# Patient Record
Sex: Male | Born: 1940 | Hispanic: No
Health system: Midwestern US, Academic
[De-identification: ages and names within clinical notes are randomized; demographics above are authoritative.]

---

## 2021-06-29 ENCOUNTER — Encounter: Admit: 2021-06-29 | Discharge: 2021-06-29 | Payer: MEDICARE

## 2021-07-23 ENCOUNTER — Encounter: Admit: 2021-07-23 | Discharge: 2021-07-23 | Payer: MEDICARE

## 2021-07-23 NOTE — Telephone Encounter
Pts daughter F.Keith LV about pts app-t. This person os not on ROI. Called and LV to pt to see if he has any questions.Pt notified that this person is not on ROI and communication cant be made. Call back provided

## 2021-08-25 ENCOUNTER — Encounter
Admit: 2021-08-25 | Discharge: 2021-08-25 | Payer: MEDICARE | Primary: Student in an Organized Health Care Education/Training Program

## 2021-08-25 ENCOUNTER — Ambulatory Visit
Admit: 2021-08-25 | Discharge: 2021-08-25 | Payer: MEDICARE | Primary: Student in an Organized Health Care Education/Training Program

## 2021-08-25 DIAGNOSIS — E538 Deficiency of other specified B group vitamins: Secondary | ICD-10-CM

## 2021-08-25 DIAGNOSIS — E119 Type 2 diabetes mellitus without complications: Secondary | ICD-10-CM

## 2021-08-25 DIAGNOSIS — E559 Vitamin D deficiency, unspecified: Secondary | ICD-10-CM

## 2021-08-25 DIAGNOSIS — E785 Hyperlipidemia, unspecified: Secondary | ICD-10-CM

## 2021-08-25 DIAGNOSIS — I10 Essential (primary) hypertension: Secondary | ICD-10-CM

## 2021-08-25 DIAGNOSIS — E7800 Pure hypercholesterolemia, unspecified: Secondary | ICD-10-CM

## 2021-08-25 DIAGNOSIS — F0390 Unspecified dementia without behavioral disturbance: Secondary | ICD-10-CM

## 2021-08-25 DIAGNOSIS — G20 Parkinson's disease: Secondary | ICD-10-CM

## 2021-08-25 DIAGNOSIS — Z9221 Personal history of antineoplastic chemotherapy: Secondary | ICD-10-CM

## 2021-08-25 LAB — CBC AND DIFF
ABSOLUTE BASO COUNT: 0 K/UL (ref 0–0.20)
ABSOLUTE EOS COUNT: 0.1 K/UL (ref 0–0.45)
ABSOLUTE LYMPH COUNT: 2.2 K/UL (ref 1.0–4.8)
ABSOLUTE MONO COUNT: 0.5 K/UL (ref 0–0.80)
ABSOLUTE NEUTROPHIL: 5.3 K/UL (ref 1.8–7.0)
BASOPHILS %: 0 % (ref 0–2)
EOSINOPHILS %: 2 % (ref >60)
HEMATOCRIT: 36 % — ABNORMAL LOW (ref 40–50)
HEMOGLOBIN: 12 g/dL — ABNORMAL LOW (ref 13.5–16.5)
LYMPHOCYTES %: 27 % — ABNORMAL LOW (ref 24–44)
MCH: 30 pg (ref 26–34)
MCHC: 34 g/dL (ref 32.0–36.0)
MCV: 89 FL (ref 80–100)
MONOCYTES %: 7 % (ref 4–12)
MPV: 7.2 FL (ref 7–11)
NEUTROPHILS %: 64 % (ref 41–77)
PLATELET COUNT: 267 K/UL (ref 150–400)
RBC COUNT: 4 M/UL — ABNORMAL LOW (ref 4.4–5.5)
RDW: 14 % (ref 11–15)
WBC COUNT: 8.3 K/UL (ref 4.5–11.0)

## 2021-08-25 LAB — COMPREHENSIVE METABOLIC PANEL
POTASSIUM: 3.7 MMOL/L (ref 3.5–5.1)
SODIUM: 141 MMOL/L (ref 137–147)

## 2021-08-25 LAB — TSH WITH FREE T4 REFLEX: TSH: 0.7 uU/mL (ref 0.35–5.00)

## 2021-08-25 LAB — VITAMIN B12: VITAMIN B12: 467 pg/mL (ref 180–914)

## 2021-09-02 ENCOUNTER — Encounter
Admit: 2021-09-02 | Discharge: 2021-09-02 | Payer: MEDICARE | Primary: Student in an Organized Health Care Education/Training Program

## 2021-09-02 NOTE — Telephone Encounter
Patient's sister LVM stating patient's BP has been low. Amlodipine was discontinued at last visit. Sister states she added it back because his BP was over 170.     Yesterday BP was 85/42. Sister states she call an ambulance and took patient to [facility] ER because he was feeling weak and continued to have low BP.     Patient is home today and has not had any BP medication for two days. BP running in low 100s. Last reading was 104/64.    Jeanine Luz, RN

## 2021-09-20 ENCOUNTER — Encounter
Admit: 2021-09-20 | Discharge: 2021-09-20 | Payer: MEDICARE | Primary: Student in an Organized Health Care Education/Training Program

## 2021-09-21 NOTE — Telephone Encounter
-----   Message from Cristy Folks, MD sent at 09/20/2021 12:45 PM CST -----  Patient sister called on-call provider last night with reported increasing confusion.  Please call and see how he is doing.  They wonder if it was due to stopping his Aricept-please see when they stopped this and see what his vital signs have been, etc.  He sees me on the eighth, please make sure they feel comfortable with waiting that long-if he is doing poorly please let me know

## 2021-09-21 NOTE — Telephone Encounter
Contacted patient's sister to follow up on patient's confusion. Sister states he is doing better and is no longer confused. Patient has been off of Aricept since last PCP visit January 11th.  BP and blood sugar have been fluctuating. BP this morning was 98/58, 101/85 at 11am, and 110/61 at 3:43pm. Blood sugar in 140s. Sister states they are comfortable waiting until f/u on 2/8 to be seen by PCP.     Jeanine Luz, RN

## 2021-09-22 ENCOUNTER — Encounter
Admit: 2021-09-22 | Discharge: 2021-09-22 | Payer: MEDICARE | Primary: Student in an Organized Health Care Education/Training Program

## 2021-09-22 ENCOUNTER — Ambulatory Visit
Admit: 2021-09-22 | Discharge: 2021-09-22 | Payer: MEDICARE | Primary: Student in an Organized Health Care Education/Training Program

## 2021-09-22 VITALS — BP 112/67 | HR 67 | Temp 97.90000°F | Resp 18 | Ht 72.0 in | Wt 187.0 lb

## 2021-09-22 DIAGNOSIS — G20 Parkinson's disease: Secondary | ICD-10-CM

## 2021-09-22 DIAGNOSIS — E785 Hyperlipidemia, unspecified: Secondary | ICD-10-CM

## 2021-09-22 DIAGNOSIS — Z9221 Personal history of antineoplastic chemotherapy: Secondary | ICD-10-CM

## 2021-09-22 DIAGNOSIS — F0390 Unspecified dementia without behavioral disturbance: Secondary | ICD-10-CM

## 2021-09-22 DIAGNOSIS — I951 Orthostatic hypotension: Secondary | ICD-10-CM

## 2021-09-22 DIAGNOSIS — R32 Unspecified urinary incontinence: Secondary | ICD-10-CM

## 2021-09-22 DIAGNOSIS — E119 Type 2 diabetes mellitus without complications: Secondary | ICD-10-CM

## 2021-09-22 DIAGNOSIS — I10 Essential (primary) hypertension: Secondary | ICD-10-CM

## 2021-09-22 DIAGNOSIS — R0989 Other specified symptoms and signs involving the circulatory and respiratory systems: Principal | ICD-10-CM

## 2021-09-22 MED ORDER — MIDODRINE 2.5 MG PO TAB
2.5 mg | ORAL_TABLET | Freq: Two times a day (BID) | ORAL | 3 refills | Status: AC | PRN
Start: 2021-09-22 — End: ?

## 2021-09-22 NOTE — Progress Notes
Date of Service: 09/22/2021    Dillon Wheeler is a 81 y.o. male.  DOB: [birth year]  MRN: [id number]     Assessment and Plan:  The patient is an 81 year old male with past medical history of non-Hodgkin's lymphoma of the testicle, low testosterone, B12 deficiency, HTN, parkinsonism presents to follow-up the issues below    #HTN  #Orthostatic hypotension: Discontinued amlodipine and lisinopril at previous visit.  Discussed with sister that I am not concerned about intermittent blood pressures in the 180s since the majority of his pressures are below 100.  Discussed the risk of orthostasis/falls/decreased perfusion with hypotension outweighs the risk of intermittently elevated SBP.  - Midodrine 2.5 mg as needed for SBP in the 80s or profound dizziness  - Advised sister not to take blood pressure as frequently (was taking more than 10 times in a row over an hour for any abnormal readings)    #decreased pulse: on R foot with sister describing intermittent mottling/bluness. Pulse not palpable on R but is 1+ on L.   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 Had resolved recently.  Currently with Foley secondary to retention in the setting of UTI.  Also had OAB symptoms.  Unclear if these were improved after stopping Aricept and also his [name] since there was not a long period of time prior to Foley placement.  - Patient sister feels he is doing much better with Foley because he is actually sleeping.  Discussed risk of long-term Foley versus straight cath and she would have to be the one to do the straight cath if he was agreeable.  We will follow-up once he has a void trial with urology.  - Advised patient sister to hold off on looking into Myrbetriq since he currently has retention  ?  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  Following with Dr. Serafina Royals.  - Continue Sinemet.  Discussed looking into long-acting overnight formulation since symptomology so much worse in the morning.  Will be establishing with neurology here in the near future  - Referral sent for PT due to frequent falls-we will need to look into if this has occurred at follow-up  - Request records from neurology again has been have not received them.  Patient sister will go to the office directly if they have not received prior to her next neurology appointment  ???  #Major neurocognitive disorder: Appears memory changes occurred before parkinsonism, so LBD is consideration.  They did have an MRI report showing severe confluent microvascular changes so there is likely a mixed picture.  - DCd Aricept due to worsening insomnia, nocturia  - Continue on statin for now. consider ASA at fu  ?  #History of alcohol use: Heavy use for 10 years, sober since 2021.  Currently on folate and thiamine.  With polypharmacy and patient eating a well-rounded diet not drinking, will discontinue these.  Could repeat levels at follow-up    #Vitamin D deficiency: continue on cholecalciferol  ?  #MDD: Continue on Lexapro  ?  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  #B12 deficiency: within range in 08/2021, continue supplement  ?  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  We will request records at follow-up  ?  #diverticulitis: with partial bowel resection in 2016.  ?  #History of non-Hodgkin's lymphoma of the testicle: Diagnosed in 2010.  S/p orchiectomy and R-CHOP.  No disease recurrence.  We will look into what follow-up needs to be done  ?  ?         Immunization History   Administered Date(s) Administered   ? COVID-19 (MODERNA), mRNA vacc, 100 mcg/0.5 mL (PF) 09/20/2019, 10/23/2019   ? COVID-19 (PFIZER), mRNA vacc, 30 mcg/0.3 mL (PF) 07/02/2020   ? Covid-19 Bivalent Booster (MODERNA), mRNA Vacc (Historical) 09/03/2021   ? Flu Vaccine =>65 YO High-Dose Quadrivalent (PF) 06/27/2019, 06/25/2020, 05/11/2021   ? Flu vaccine, inj unspecified (Historical) 06/27/2019 ? Pneumococcal Vaccine (23-Val Adult) 08/20/2018   ? Pneumococcal Vaccine(13-Val Peds/immunocompromised adult) 06/26/2012   ? Tdap Vaccine 06/26/2012   ? Varicella-Zoster Vaccine - live (ZOSTAVAX) 07/31/2014     Health Maintenance Summary     This patient has no relevant Health Maintenance data.        A total of 10 minutes was spent on chart review  A total of 40 minutes was spent on the visit including gathering history, examining patient, interpreting results, communicating with other providers and developing an assessment and plan.    Subjective:             History of Present Illness  #HTN: Stopped amlodipine and kept on lisinopril last visit.  They did have a period time where his blood pressures were running higher so they restarted his amlodipine and systolics fell to the 70s and 80s.     #Urinary incontinence: Stopped Aricept and Toviaz.  Treat BPH?  Got a foley last week. Not sureif he was sleeping better prior to the foley.    #GERD: Stop PPI an no recurrent symptoms.     #DM2: A1c 6.6 in 08/2021 and metformin advised to stop but they didn't. Worried bc level has been elevate dto 170s occasioanlly    #Increased confusion?-maybe 2/2 uti     Review of Systems        Surgical History:   Procedure Laterality Date   ? ORCHIECTOMY  03/06/2009    right     No family history on file.  Social History     Socioeconomic History   ? Marital status: Widowed   ? Number of children: 3   Tobacco Use   ? Smoking status: Never   Substance and Sexual Activity   ? Alcohol use: Yes     Comment: none currently. previosuly 6pk per week. Past 45yr sister states daily   ? Drug use: No   Social History Narrative    Lives with sister.     Dependent in all IADLs    Widowed 10uyr ago. Was in a long term relationship after that that was unhealthy per family.        Objective:         ? carbidopa/levodopa (SINEMET) 25/100 mg tablet    ? CHOLEcalciferoL (vitamin D3) (VITAMIN D3) 1,000 units tablet Take 5,000 Units by mouth daily.   ? CYANOCOBALAMIN (VITAMIN B-12) (VITAMIN B-12 PO) Take 1,000 mg by mouth daily.   ? escitalopram oxalate (LEXAPRO) 20 mg tablet Take 20 mg by mouth daily.   ? lisinopriL (ZESTRIL) 20 mg tablet Take 20 mg by mouth daily.   ? metFORMIN (GLUCOPHAGE) 500 mg tablet Take 500 mg by mouth twice daily with meals.   ? other medication Take 1 Dose by mouth twice daily. MacuTab 2X Extra streangth Eye Vitamin   ? pravastatin (PRAVACHOL) 40 mg tablet Take 40 mg by mouth daily.   ? vitamins, B complex tab Take 1 tablet  by mouth daily.     There were no vitals filed for this visit.  There is no height or weight on file to calculate BMI.     Labwork reviewed:  Lab Results   Component Value Date/Time    HGBA1C 6.6 (H) 08/25/2021 06:05 PM    TSH 0.79 08/25/2021 06:05 PM    NA 141 08/25/2021 06:05 PM    K 3.7 08/25/2021 06:05 PM    CL 103 08/25/2021 06:05 PM    CO2 26 08/25/2021 06:05 PM    GAP 12 08/25/2021 06:05 PM    BUN 22 08/25/2021 06:05 PM    CR 0.81 08/25/2021 06:05 PM    GLU 160 (H) 08/25/2021 06:05 PM    CA 9.5 08/25/2021 06:05 PM    ALBUMIN 3.9 08/25/2021 06:05 PM    TOTPROT 7.2 08/25/2021 06:05 PM    ALKPHOS 63 08/25/2021 06:05 PM    AST 14 08/25/2021 06:05 PM    ALT 3 (L) 08/25/2021 06:05 PM    TOTBILI 0.5 08/25/2021 06:05 PM    GFR >60 01/30/2014 08:27 AM    GFRAA >60 01/30/2014 08:27 AM         Physical Exam

## 2021-09-28 ENCOUNTER — Encounter
Admit: 2021-09-28 | Discharge: 2021-09-28 | Payer: MEDICARE | Primary: Student in an Organized Health Care Education/Training Program

## 2021-10-01 ENCOUNTER — Encounter
Admit: 2021-10-01 | Discharge: 2021-10-01 | Payer: MEDICARE | Primary: Student in an Organized Health Care Education/Training Program

## 2021-10-05 ENCOUNTER — Encounter
Admit: 2021-10-05 | Discharge: 2021-10-05 | Payer: MEDICARE | Primary: Student in an Organized Health Care Education/Training Program

## 2021-10-06 NOTE — Telephone Encounter
Some refill protocol elements NOT Met  Medication name: Escitalopram  Medication Strength: 110m    Beers Criteria: Use with caution in patients 65+    Routed to Provider     Requested Prescriptions   Pending Prescriptions Disp Refills    escitalopram oxalate (LEXAPRO) 20 mg tablet 90 tablet 0     Sig: Take one tablet by mouth daily.       Psychiatry: Antidepressants - SSRI, TCAs, Serotonin Modulators & SNeRIs Failed - 10/05/2021  6:08 PM        Failed - Patient is < 613years old. Beers Criteria: Use with caution in patients 65+        Passed - Depression Screening completed within the last 6 months        Passed - Valid encounter within last 6 months     Recent Visits  Date Type Provider Dept   09/22/21 Office Visit Stand, AHattie Perch MD [facility]   08/25/21 Office Visit Stand, AHattie Perch MD [facility]   Showing recent visits within past 720 days and meeting all other requirements  Future Appointments  Date Type Provider Dept   12/01/21 Appointment Stand, AHattie Perch MD [facility]   Showing future appointments within next 360 days and meeting all other requirements               CJeanine Luz RN

## 2021-10-12 ENCOUNTER — Encounter
Admit: 2021-10-12 | Discharge: 2021-10-12 | Payer: MEDICARE | Primary: Student in an Organized Health Care Education/Training Program

## 2021-10-19 ENCOUNTER — Encounter
Admit: 2021-10-19 | Discharge: 2021-10-19 | Payer: MEDICARE | Primary: Student in an Organized Health Care Education/Training Program

## 2021-11-09 ENCOUNTER — Encounter
Admit: 2021-11-09 | Discharge: 2021-11-09 | Payer: MEDICARE | Primary: Student in an Organized Health Care Education/Training Program

## 2021-11-10 ENCOUNTER — Encounter
Admit: 2021-11-10 | Discharge: 2021-11-10 | Payer: MEDICARE | Primary: Student in an Organized Health Care Education/Training Program

## 2021-11-16 ENCOUNTER — Encounter
Admit: 2021-11-16 | Discharge: 2021-11-16 | Payer: MEDICARE | Primary: Student in an Organized Health Care Education/Training Program

## 2021-11-23 ENCOUNTER — Encounter
Admit: 2021-11-23 | Discharge: 2021-11-23 | Payer: MEDICARE | Primary: Student in an Organized Health Care Education/Training Program

## 2021-11-23 DIAGNOSIS — F0390 Unspecified dementia without behavioral disturbance: Secondary | ICD-10-CM

## 2021-11-23 DIAGNOSIS — G20 Parkinson's disease: Secondary | ICD-10-CM

## 2021-11-23 NOTE — Telephone Encounter
Dillon Wheeler with [facility] LVM stating she met with patient's family today and they would like to elect for hospice. Requesting order to eval and treat for hospice.     Jeanine Luz, RN

## 2021-11-23 NOTE — Telephone Encounter
Order faxed to [facility] at [phone].  Jeanine Luz, RN

## 2021-12-01 ENCOUNTER — Encounter
Admit: 2021-12-01 | Discharge: 2021-12-01 | Payer: MEDICARE | Primary: Student in an Organized Health Care Education/Training Program

## 2021-12-01 ENCOUNTER — Ambulatory Visit
Admit: 2021-12-01 | Discharge: 2021-12-01 | Payer: MEDICARE | Primary: Student in an Organized Health Care Education/Training Program

## 2021-12-01 DIAGNOSIS — I10 Essential (primary) hypertension: Secondary | ICD-10-CM

## 2021-12-01 DIAGNOSIS — G20 Parkinson's disease: Secondary | ICD-10-CM

## 2021-12-01 DIAGNOSIS — Z9221 Personal history of antineoplastic chemotherapy: Secondary | ICD-10-CM

## 2021-12-01 DIAGNOSIS — F0390 Unspecified dementia without behavioral disturbance: Secondary | ICD-10-CM

## 2021-12-01 DIAGNOSIS — I951 Orthostatic hypotension: Secondary | ICD-10-CM

## 2021-12-01 DIAGNOSIS — R32 Unspecified urinary incontinence: Secondary | ICD-10-CM

## 2021-12-01 DIAGNOSIS — E119 Type 2 diabetes mellitus without complications: Secondary | ICD-10-CM

## 2021-12-01 DIAGNOSIS — E785 Hyperlipidemia, unspecified: Secondary | ICD-10-CM

## 2021-12-01 NOTE — Progress Notes
Date of Service: 12/01/2021    Dillon Wheeler is a 81 y.o. male.  DOB: 01-02-1941  MRN: [id number]     Assessment and Plan:  The patient is an 81 year old male with past medical history of non-Hodgkin's lymphoma of the testicle, low testosterone, B12 deficiency, HTN, parkinsonism presents to follow-up the issues below  ?  #Orthostatic hypotension: Discontinued amlodipine and lisinopril at previous visit.  Discussed with sister that I am not concerned about intermittent blood pressures in the 180s since the majority of his pressures are below 100.  Discussed the risk of orthostasis/falls/decreased perfusion with hypotension outweighs the risk of intermittently elevated SBP.  - Midodrine 2.5 mg as needed for SBP in the 80s or profound dizziness  - Advised sister not to take blood pressure as frequently (was taking more than 10 times in a row over an hour for any abnormal readings)  ?  #Urinary retention:  #Urinary incontinence: With history of urinary retention requiring straight cath for several years. Had resolved recently.  Currently with Foley secondary to retention in the setting of UTI.  Also had OAB symptoms.  Unclear if these were improved after stopping Aricept and also his [name] since there was not a long period of time prior to Foley placement.  - Patient to see urology in the near future.  Family feels that his quality of life is much better with the chronic indwelling Foley.  Discussed that there is an increased risk of infection, but we have focusing solely on quality of life they could consider maintaining it.  ?  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 ?Following with Dr. Serafina Royals.  - Continue Sinemet. ?Discussed looking into long-acting overnight formulation since symptomology so much worse in the morning.  Will be establishing with neurology here in the near future  - Patient to U-walker in the near future  - Referral was sent to hospice previously but have not set this up yet. Still considering.  [facility] out[facility] and they will call the [place]  -Continue with PT for now prior to establishing with hospice    #Major neurocognitive disorder: Appears memory changes occurred before parkinsonism, so LBD is consideration. ?They did have an MRI report showing severe confluent microvascular changes so there is likely a mixed picture.  - DCd Aricept due to worsening insomnia, nocturia  - Continue on statin for now. consider ASA at fu    #DM2:?A1c 6.6 at last appointment stopped his metformin at that time.  Now with some BG's in the 400s leading to dehydration and worsening cognition acutely.  Discussed that we typically would not have on medication for metformin in the circumstance but would like to avoid increasing symptoms including orthostasis due to dehydration from hyperglycemia.  Offered a restart and they would like to speak to his sister first.  ?  #History of alcohol use: Heavy use for 10 years, sober since 2021. ?Currently on folate and thiamine. ?With polypharmacy and patient eating a well-rounded diet not drinking, will discontinue these. ?Could repeat levels at follow-up.  ?  #Vitamin D deficiency: continue on cholecalciferol  ?  #MDD: Continue on Lexapro  ?  #B12 deficiency:?within range in 08/2021, continue supplement  ?  #pituitary adenoma:?Noted on MRI they brought in. ?We will request records at follow-up  ?  #diverticulitis: with partial bowel resection in 2016.  ?  #History of non-Hodgkin's lymphoma of the testicle: Diagnosed in 2010. ?S/p orchiectomy and  R-CHOP. ?No disease recurrence. ?We will look into what follow-up needs to be done  ?  ?  ?         Immunization History   Administered Date(s) Administered   ? COVID-19 (MODERNA), mRNA vacc, 100 mcg/0.5 mL (PF) 09/20/2019, 10/23/2019   ? COVID-19 (PFIZER), mRNA vacc, 30 mcg/0.3 mL (PF) 07/02/2020   ? Covid-19 Bivalent Booster (MODERNA), mRNA Vacc (Historical) 09/03/2021   ? Flu Vaccine =>65 YO High-Dose Quadrivalent (PF) 06/27/2019, 06/25/2020, 05/11/2021   ? Flu vaccine, inj unspecified (Historical) 06/27/2019   ? Pneumococcal Vaccine (23-Val Adult) 08/20/2018   ? Pneumococcal Vaccine(13-Val Peds/immunocompromised adult) 06/26/2012   ? Tdap Vaccine 06/26/2012   ? Varicella-Zoster Vaccine - live (ZOSTAVAX) 07/31/2014     Health Maintenance Summary    -      Overdue - DIABETES FOOT EXAM (Yearly) Overdue - never done    No completion history exists for this topic.          Overdue - DIABETES DILATED EYE EXAM (Yearly) Overdue - never done    No completion history exists for this topic.          DIABETES HBA1C (Every 6 Months) Next due on 02/22/2022    08/25/2021  HEMOGLOBIN A1C               A total of 7 minutes was spent on chart review  A total of 40 minutes was spent on the visit including gathering history, examining patient, interpreting results, communicating with other providers and developing an assessment and plan.    Subjective:             History of Present Illness  #Parkinsonism: On Sinemet.  Recent hospice admission.  Had order sent for you walker and is working with PT.    #Orthostasis: Has stopped blood pressure medicines and started midodrine as needed. Gaylyn Rong snot sued it.     #Falls:more frequent. He is walking almost 36ft with walker. Has u walker pending. He is getting on his bike.  Has required assistance sitting up in his bed/transfers/toileting    Still has Foley.  Sees urology in the near future.  They wonder if he can keep the Foley since it has improved his sleep and overall alertness during the day    Has been a lot of sweets and this is important for his quality of life.  Has had some blood sugars in the 400s which they noticed when he was dehydrated and acting acutely worse.           Review of Systems        Surgical History:   Procedure Laterality Date   ? ORCHIECTOMY  03/06/2009    right     No family history on file.  Social History Socioeconomic History   ? Marital status: Widowed   ? Number of children: 3   Tobacco Use   ? Smoking status: Former     Types: Cigars   Vaping Use   ? Vaping Use: Never used   Substance and Sexual Activity   ? Alcohol use: Yes     Comment: none currently. previosuly 6pk per week. Past 35yr sister states daily   ? Drug use: No   Social History Narrative    Lives with sister.     Dependent in all IADLs    Widowed 10uyr ago. Was in a long term relationship after that that was unhealthy per family.        Objective:         ?  carbidopa/levodopa (SINEMET) 25/100 mg tablet    ? CHOLEcalciferoL (vitamin D3) (VITAMIN D3) 1,000 units tablet Take 5,000 Units by mouth daily.   ? CYANOCOBALAMIN (VITAMIN B-12) (VITAMIN B-12 PO) Take 1,000 mg by mouth daily.   ? escitalopram oxalate (LEXAPRO) 20 mg tablet Take one tablet by mouth daily.   ? other medication Take 1 Dose by mouth twice daily. MacuTab 2X Extra streangth Eye Vitamin   ? pravastatin (PRAVACHOL) 40 mg tablet Take 40 mg by mouth daily.   ? vitamins, B complex tab Take 1 tablet by mouth daily.     There were no vitals filed for this visit.  There is no height or weight on file to calculate BMI.     Labwork reviewed:  Lab Results   Component Value Date/Time    HGBA1C 6.6 (H) 08/25/2021 06:05 PM    TSH 0.79 08/25/2021 06:05 PM    NA 141 08/25/2021 06:05 PM    K 3.7 08/25/2021 06:05 PM    CL 103 08/25/2021 06:05 PM    CO2 26 08/25/2021 06:05 PM    GAP 12 08/25/2021 06:05 PM    BUN 22 08/25/2021 06:05 PM    CR 0.81 08/25/2021 06:05 PM    GLU 160 (H) 08/25/2021 06:05 PM    CA 9.5 08/25/2021 06:05 PM    ALBUMIN 3.9 08/25/2021 06:05 PM    TOTPROT 7.2 08/25/2021 06:05 PM    ALKPHOS 63 08/25/2021 06:05 PM    AST 14 08/25/2021 06:05 PM    ALT 3 (L) 08/25/2021 06:05 PM    TOTBILI 0.5 08/25/2021 06:05 PM    GFR >60 01/30/2014 08:27 AM    GFRAA >60 01/30/2014 08:27 AM         Physical Exam

## 2022-01-04 ENCOUNTER — Encounter
Admit: 2022-01-04 | Discharge: 2022-01-04 | Payer: MEDICARE | Primary: Student in an Organized Health Care Education/Training Program

## 2022-02-02 ENCOUNTER — Encounter
Admit: 2022-02-02 | Discharge: 2022-02-02 | Payer: MEDICARE | Primary: Student in an Organized Health Care Education/Training Program

## 2022-02-02 MED ORDER — METFORMIN 500 MG PO TAB
ORAL_TABLET | 0 refills | Status: AC
Start: 2022-02-02 — End: ?

## 2022-02-02 NOTE — Telephone Encounter
Requested Prescriptions   Pending Prescriptions Disp Refills   ? metFORMIN (GLUCOPHAGE) 500 mg tablet [Pharmacy Med Name: metFORMIN HCl 500 MG Oral Tablet] 155 tablet 0     Sig: TAKE 1 TABLET BY MOUTH TWICE DAILY FOR 7 DAYS THEN 1 IN THE MORNING AND 2 IN THE EVENING FOR 7 DAYS AND THEN 2 TABS TWICE DAILY THEREAFTER       Endocrinology: Diabetes - Biguanides Passed - 02/02/2022  2:12 PM        Passed - Valid encounter within last 6 months     Recent Visits  Date Type Provider Dept   12/01/21 Office Visit Stand, Sibyl Parr, MD [facility]   09/22/21 Office Visit Stand, Sibyl Parr, MD [facility]   08/25/21 Office Visit Stand, Sibyl Parr, MD [facility]   Showing recent visits within past 720 days and meeting all other requirements  Future Appointments  Date Type Provider Dept   03/02/22 Appointment Stand, Sibyl Parr, MD [facility]   Showing future appointments within next 360 days and meeting all other requirements            Passed - Cr in normal range and within 360 days     Creatinine   Date Value Ref Range Status   08/25/2021 0.81 0.4 - 1.24 MG/DL Final   28/41/3244 0.10 0.4 - 1.24 MG/DL Final              Passed - HBA1C within 360 days     Hemoglobin A1C   Date Value Ref Range Status   08/25/2021 6.6 (H) 4.0 - 6.0 % Final     Comment:     The ADA recommends that most patients with type 1 and type 2 diabetes maintain   an A1c level <7%.        Vitamin B12   Date Value Ref Range Status   08/25/2021 467 180 - 914 PG/ML Final               Passed - eGFR in normal range and within 360 days     eGFR Non African American   Date Value Ref Range Status   01/30/2014 >60 >60 ML/MIN/1.73 SQM Final     Comment:     The eGFR is not validated for use in drug dosing adjustments.  Continue to use   estimated creatinine clearance per dosing reference text.  Please contact the   Clinical Pharmacist for questions.     07/30/2013 >60 >60 ML/MIN/1.73 SQM Final     Comment:     The eGFR is not validated for use in drug dosing adjustments.  Continue to use   estimated creatinine clearance per dosing reference text.  Please contact the   Clinical Pharmacist for questions.     eGFR African American   Date Value Ref Range Status   01/30/2014 >60 >60 ML/MIN/1.73 SQM Final     Comment:     The eGFR is not validated for use in drug dosing adjustments.  Continue to use   estimated creatinine clearance per dosing reference text.  Please contact the   Clinical Pharmacist for questions.     07/30/2013 >60 >60 ML/MIN/1.73 SQM Final     Comment:     The eGFR is not validated for use in drug dosing adjustments.  Continue to use   estimated creatinine clearance per dosing reference text.  Please contact the  Clinical Pharmacist for questions.     eGFR   Date Value Ref Range Status   08/25/2021 >60 >60 mL/min Final     Comment:     eGFR calculated using the CKD-EPIcr_R equation                 Darrold Span, RN

## 2022-02-03 ENCOUNTER — Encounter
Admit: 2022-02-03 | Discharge: 2022-02-03 | Payer: MEDICARE | Primary: Student in an Organized Health Care Education/Training Program

## 2022-02-03 NOTE — Telephone Encounter
Hope with [place] office at [facility] in [place].     We had done a diabetic foot exam on this patient and hanger had gotten in touch with [facility]. They are going to fax [place] a copy of the diabetic statement and Dr. Sarajane Jews needs to circle all that apply on question 2. There are letters there and circle all letters that apply to the patient. If you have questions or need a copy of the fax the number is [phone]     Kerrin Champagne, RN  CPK

## 2022-02-25 ENCOUNTER — Encounter
Admit: 2022-02-25 | Discharge: 2022-02-25 | Payer: MEDICARE | Primary: Student in an Organized Health Care Education/Training Program

## 2022-03-02 ENCOUNTER — Encounter
Admit: 2022-03-02 | Discharge: 2022-03-02 | Payer: MEDICARE | Primary: Student in an Organized Health Care Education/Training Program

## 2022-03-02 ENCOUNTER — Ambulatory Visit
Admit: 2022-03-02 | Discharge: 2022-03-03 | Payer: MEDICARE | Primary: Student in an Organized Health Care Education/Training Program

## 2022-03-02 DIAGNOSIS — R32 Unspecified urinary incontinence: Secondary | ICD-10-CM

## 2022-03-02 DIAGNOSIS — Z9221 Personal history of antineoplastic chemotherapy: Secondary | ICD-10-CM

## 2022-03-02 DIAGNOSIS — C8590 Non-Hodgkin lymphoma, unspecified, unspecified site: Secondary | ICD-10-CM

## 2022-03-02 DIAGNOSIS — E785 Hyperlipidemia, unspecified: Secondary | ICD-10-CM

## 2022-03-02 DIAGNOSIS — I951 Orthostatic hypotension: Secondary | ICD-10-CM

## 2022-03-02 DIAGNOSIS — G20 Parkinson's disease: Secondary | ICD-10-CM

## 2022-03-02 DIAGNOSIS — E119 Type 2 diabetes mellitus without complications: Secondary | ICD-10-CM

## 2022-03-02 DIAGNOSIS — I10 Essential (primary) hypertension: Secondary | ICD-10-CM

## 2022-03-02 NOTE — Progress Notes
Date of Service: 03/02/2022    Dillon Wheeler is a 81 y.o. male.  DOB: 06-08-1941  MRN: [id number]     Assessment and Plan:  The patient is an 81 year old male with past medical history of non-Hodgkin's lymphoma of the testicle, low testosterone, B12 deficiency, HTN, parkinsonism presents to?follow-up the issues below  ?  #Orthostatic hypotension: Discontinued amlodipine and lisinopril at previous visit. ?Has been much improved.  - Midodrine 2.5 mg as needed for SBP in the 80s or profound dizziness  ?  #Urinary retention:  #Urinary incontinence: With history of urinary retention requiring straight cath for several years.?Had resolved recently.??Currently with Foley secondary to retention in the setting of UTI. ?Also had OAB symptoms. ?Unclear if these were improved after stopping Aricept and also his [name] since there was not a long period of time prior to Foley placement.  - Patient has decided with urology to maintain Foley for now.  Discussed that if they ever decide to stop it, we can do condom catheter overnight  ?  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 ?Following with Dr. Serafina Royals.  - Continue Sinemet. ?Discussed looking into long-acting overnight formulation since symptomology so much worse in the morning.??Will be establishing with neurology here in the near future  -  Continue with U-walker  - Patient following with hospice.  Discussed that they could ask hospice for a PT referral for a very specific set goal such as improvement in transfer.  He will need to be able to maintain his transfer ability in order to stay at home with his sister who is his primary caregiver.  This could be a goal to work towards with PT  - Discussed using power pudding (prune juice, applesauce, bran flakes) in addition to his as needed Dulcolax  ?  #Major neurocognitive disorder: Appears memory changes occurred before parkinsonism, so LBD is consideration. ?They did have an MRI report showing severe confluent microvascular changes so there is likely a mixed picture.  - DCd?Aricept due to worsening insomnia, nocturia  -  No longer on statin or aspirin with goals of care   ?  #DM2:?A1c 6.6 at last appointment stopped his metformin at that time.  Now with some BG's in the 400s leading to dehydration and worsening cognition acutely.  Discussed that we typically would not have on medication for metformin in the circumstance but would like to avoid increasing symptoms including orthostasis due to dehydration from hyperglycemia.  Continue on metformin  ?  #History of alcohol use: Heavy use for 10 years, sober since 2021. ?Currently on folate and thiamine. ?With polypharmacy and patient eating a well-rounded diet not drinking, will discontinue these. ?Could repeat levels at follow-up.  ?  #Vitamin D deficiency: continue on cholecalciferol  ?  #MDD: Continue on sertraline  ?  #B12 deficiency:?within range in 08/2021, continue supplement  ?  #pituitary adenoma:?Noted on MRI they brought in. ?We will request records at follow-up  ?  #diverticulitis: with partial bowel resection in 2016.  ?  #History of non-Hodgkin's lymphoma of the testicle: Diagnosed in 2010. ?S/p orchiectomy and R-CHOP. ?No disease recurrence. ?We will look into what follow-up needs to be done  ?  ?         Immunization History   Administered Date(s) Administered   ? COVID-19 (MODERNA BOOSTER), mRNA vacc, 50 mcg/0.5 mL (PF) 10/23/2019   ? COVID-19 (MODERNA), mRNA vacc, 100 mcg/0.5 mL (PF) 09/20/2019, 10/23/2019   ? COVID-19 (PFIZER), mRNA vacc, 30 mcg/0.3 mL (  PF) 07/02/2020   ? Covid-19 Bivalent (66yr+)(MODERNA), mRNA Vacc, 50 mcg/0.5 mL (PF) 09/03/2021   ? Covid-19 Bivalent (MODERNA), mRNA Vacc (Historical) 09/03/2021   ? Flu Vaccine =>65 YO High-Dose Quadrivalent (PF) 06/27/2019, 06/25/2020, 05/11/2021   ? Flu vaccine, inj unspecified (Historical) 06/27/2019   ? Pneumococcal Vaccine (23-Val Adult) 08/20/2018   ? Pneumococcal Vaccine(13-Val Peds/immunocompromised adult) 06/26/2012   ? Tdap Vaccine 06/26/2012   ? Varicella-Zoster Vaccine - live (ZOSTAVAX) 07/31/2014     Health Maintenance Summary    -      Overdue - DIABETES FOOT EXAM (.dmfoot) (Yearly) Overdue - never done    No completion history exists for this topic.          Overdue - DIABETES DILATED EYE EXAM (Yearly) Overdue - never done    No completion history exists for this topic.          Overdue - DIABETES HBA1C (Every 6 Months) Overdue since 02/22/2022    08/25/2021  HEMOGLOBIN A1C              A total of 4 minutes was spent on chart review  A total of 45 minutes was spent on the visit including gathering history, examining patient, interpreting results, communicating with other providers and developing an assessment and plan.    Subjective:             Patient Reported Other  What topic(s) would you like to cover during your appointment?:  General check up and check in  While sitting tends to lean to one side more and more   Speech sometimes hard to understand (voice volume comes and goes)  Please describe the issue(s) and history with the issue (location, severity, duration, symptoms, etc.).:  Parkinson's disease is progressing  What has been done so far to take care of the issue(s)?:  Medications as prescribed by neurologists and waiting for an appointment with Dr. Nedra Hai at [facility].  What are your goals for this visit?:  To continue focusing on quality of care / life    #MDD: started on sertraline and is helping    #dysphagia:: his sister started him on thickened liquids and feels like this is helping.   Not eating as much, but he is drinking a prtein shake. Feels like he is drinking just as much without issue.     He is walking from bedroom to back porch with his U walker and support.    #consipation: he actually gets diarrhea if gets a pill of dulocolax before eating.     #Orthostatic hypotension: Educated on the risk of HTN.  Sister was concerned about SBP in the 180s occasionally was tested blood pressure 10 times neuro.  Prescribed midodrine 2.5 as needed for SBP in the 80s or dizziness  --ha sbene tesitng biw and sbo 140s.     #Urinary retention: Was supposed to see urology to decide on Foley, they decided to keep it. He is bothere dy it, but also not havign to wake up at night.     #PD:  - u walker as above  - Hospice was set     #DM2: A1c 6.6, the stop metformin and blood sugars in the 400s leading to dehydration and worsening cognition.  Started back on metformin    #HCM:       Review of Systems        Surgical History:   Procedure Laterality Date   ? ORCHIECTOMY  03/06/2009    right  No family history on file.  Social History     Socioeconomic History   ? Marital status: Widowed   ? Number of children: 3   Tobacco Use   ? Smoking status: Former     Types: Cigars   Vaping Use   ? Vaping Use: Never used   Substance and Sexual Activity   ? Alcohol use: Yes     Comment: none currently. previosuly 6pk per week. Past 82yr sister states daily   ? Drug use: No   Social History Narrative    Lives with sister.     Dependent in all IADLs    Widowed 10uyr ago. Was in a long term relationship after that that was unhealthy per family.        Objective:         ? carbidopa/levodopa (SINEMET) 25/100 mg tablet    ? CHOLEcalciferoL (vitamin D3) (VITAMIN D3) 1,000 units tablet Take five tablets by mouth daily.   ? CYANOCOBALAMIN (VITAMIN B-12) (VITAMIN B-12 PO) Take 1,000 mg by mouth daily.   ? escitalopram oxalate (LEXAPRO) 20 mg tablet Take one tablet by mouth daily.   ? metFORMIN (GLUCOPHAGE) 500 mg tablet TAKE 1 TABLET BY MOUTH TWICE DAILY FOR 7 DAYS THEN 1 IN THE MORNING AND 2 IN THE EVENING FOR 7 DAYS AND THEN 2 TABS TWICE DAILY THEREAFTER   ? other medication Take one Dose by mouth twice daily. MacuTab 2X Extra streangth Eye Vitamin   ? pravastatin (PRAVACHOL) 40 mg tablet Take one tablet by mouth daily.   ? vitamins, B complex tab Take one tablet by mouth daily.     There were no vitals filed for this visit.  There is no height or weight on file to calculate BMI.     Labwork reviewed:  Lab Results   Component Value Date/Time    HGBA1C 6.6 (H) 08/25/2021 06:05 PM    TSH 0.79 08/25/2021 06:05 PM    NA 141 08/25/2021 06:05 PM    K 3.7 08/25/2021 06:05 PM    CL 103 08/25/2021 06:05 PM    CO2 26 08/25/2021 06:05 PM    GAP 12 08/25/2021 06:05 PM    BUN 22 08/25/2021 06:05 PM    CR 0.81 08/25/2021 06:05 PM    GLU 160 (H) 08/25/2021 06:05 PM    CA 9.5 08/25/2021 06:05 PM    ALBUMIN 3.9 08/25/2021 06:05 PM    TOTPROT 7.2 08/25/2021 06:05 PM    ALKPHOS 63 08/25/2021 06:05 PM    AST 14 08/25/2021 06:05 PM    ALT 3 (L) 08/25/2021 06:05 PM    TOTBILI 0.5 08/25/2021 06:05 PM    GFR >60 01/30/2014 08:27 AM    GFRAA >60 01/30/2014 08:27 AM         Physical Exam

## 2022-03-02 NOTE — Patient Instructions
INSTRUCTIONS FROM TODAY'S VISIT  Medication changes:     Tests ordered:     Referrals:     Other:  --you can do "power pudding" with prune juice, apple sauce and crunched up bran flakes.     How to reach [place]:   Nurse Charlynne Pander [phone] or send a MyChart message to the General and Geriatric Medicine clinic. Please only use MyChart for NON-URGENT matters and expect a response within several days. For urgent matters, call the nurse line.  After hours: For urgent issues call [phone] and tell them Dr. Meriel Pica is your physician. One of the providers in our GERIATRICS group will be paged.   Scheduling: [phone]  Fax number: [phone]  How to get a medication refill: Please use the MyChart Refill request or contact your pharmacy directly to request medication refills. It may take up to 72 hours for refill requests. If you need a refill sooner, please call my nurse line.  How to receive your test results: If you have signed up for MyChart, you will receive your test results and messages from me this way. Otherwise, you will get a phone call or letter. If you are expecting results and have not heard from my office within 2 weeks of your testing, please send a MyChart message or call my office.   If you do not have MyChart, our staff will assist you in signing up.        I appreciate the opportunity to care for you today!

## 2022-03-10 ENCOUNTER — Encounter
Admit: 2022-03-10 | Discharge: 2022-03-10 | Payer: MEDICARE | Primary: Student in an Organized Health Care Education/Training Program

## 2022-03-10 NOTE — Patient Instructions
Thank you for choosing to let us care for you today.  Please read through the following information that will help [place] continue to provide the best care possible.     -- Preferred method of communication is through [employer], if the issue cannot wait until your next scheduled follow up.   -- MyChart may be used for non-emergent communication. Emails are not reviewed after hours or over the weekend/holidays/after 4PM. Staff will reply to your email within 24-48 business hours.       -- If you do not hear from [place] within one week of a lab or imaging study being completed, please call/send my chart email to the office to be sure that we have received the results. This is especially challenging when tests are done outside of the [employer] system, as many times results do not make it back to our office for a variety of reasons. In our office no news is good news does not apply. You should hear from [place] with results for each test.  [place] lab/imaging results:  Due to the CARES act, results automatically release to MyChart.  Dr. Nedra Hai will continue to send you a result note on any labs that she orders.  With these changes you may see your results before Dr. Nedra Hai does.   Please allow up to 72 hours for review and response to your results.     -- If you are having acute (new/sudden onset) or severe/worsening neurologic symptoms, please call 911 or seek care in ED.    -- For scheduling of IMAGING/RADIOLOGY, please call [phone] at your convenience to schedule your studies.  -- For referrals placed during the visit, if you have not heard from scheduling within one week, please call the call center at [phone] to get scheduling assistance.  -- For refills on medications, please first contact your pharmacy, who will fax a refill authorization request form to our office.  Weekdays only. Allow up to 2 business days for refills. Please plan ahead, as refills will not be filled after hours.    -- Our scheduling staff may be reached at [phone] opt. 1 for scheduling needs.     -- Vonda Antigua, RN may be contacted at [phone] for urgent needs. Staff will return your call within 24 business hours.     For Appointments:   -- Please try to arrive early for your appointment time to help facilitate your visit. 15 minutes early is recommended.   -- If you are late to your appointment, we reserve the right to ask you to reschedule or wait until next available time to be seen in fairness to other patients scheduled that day.   -- There are times when we are running behind in clinic. Our goal is to always be on time, however, there are time when unexpected events occur with patients, which may cause a delay. We appreciate your understanding when this occurs.

## 2022-03-11 ENCOUNTER — Encounter
Admit: 2022-03-11 | Discharge: 2022-03-11 | Payer: MEDICARE | Primary: Student in an Organized Health Care Education/Training Program

## 2022-03-11 ENCOUNTER — Ambulatory Visit
Admit: 2022-03-11 | Discharge: 2022-03-12 | Payer: MEDICARE | Primary: Student in an Organized Health Care Education/Training Program

## 2022-03-11 VITALS — BP 136/75 | HR 69 | Temp 96.30000°F | Ht 71.0 in | Wt 190.8 lb

## 2022-03-11 DIAGNOSIS — R413 Other amnesia: Secondary | ICD-10-CM

## 2022-03-11 DIAGNOSIS — R350 Frequency of micturition: Secondary | ICD-10-CM

## 2022-03-11 DIAGNOSIS — R4189 Other symptoms and signs involving cognitive functions and awareness: Secondary | ICD-10-CM

## 2022-03-11 DIAGNOSIS — R3915 Urgency of urination: Secondary | ICD-10-CM

## 2022-03-11 DIAGNOSIS — I10 Essential (primary) hypertension: Secondary | ICD-10-CM

## 2022-03-11 DIAGNOSIS — E785 Hyperlipidemia, unspecified: Secondary | ICD-10-CM

## 2022-03-11 DIAGNOSIS — F0390 Unspecified dementia without behavioral disturbance: Secondary | ICD-10-CM

## 2022-03-11 DIAGNOSIS — F3289 Other specified depressive episodes: Secondary | ICD-10-CM

## 2022-03-11 DIAGNOSIS — G20 Parkinson's disease: Secondary | ICD-10-CM

## 2022-03-11 DIAGNOSIS — G259 Extrapyramidal and movement disorder, unspecified: Secondary | ICD-10-CM

## 2022-03-11 DIAGNOSIS — C801 Malignant (primary) neoplasm, unspecified: Secondary | ICD-10-CM

## 2022-03-11 DIAGNOSIS — E119 Type 2 diabetes mellitus without complications: Secondary | ICD-10-CM

## 2022-03-11 DIAGNOSIS — Z9221 Personal history of antineoplastic chemotherapy: Secondary | ICD-10-CM

## 2022-03-11 DIAGNOSIS — R259 Unspecified abnormal involuntary movements: Secondary | ICD-10-CM

## 2022-03-11 DIAGNOSIS — R269 Unspecified abnormalities of gait and mobility: Secondary | ICD-10-CM

## 2022-03-11 MED ORDER — CARBIDOPA-LEVODOPA 25-100 MG PO TAB
2 | ORAL_TABLET | Freq: Every day | ORAL | 3 refills | Status: AC
Start: 2022-03-11 — End: ?

## 2022-03-11 NOTE — Progress Notes
ATTESTATION    I have personally interviewed and examined Dillon Wheeler  and reviewed the history, examination, impression, and plan of care as outlined by the fellow. I personally  participated in patient counseling and coordination of care.  I agree with the assessment and plan as documented by the fellow, Dr Redgie Grayer.     Staff name:  Danny Lawless, MD Date:  03/11/2022         [facility]  Department of Neurology   [facility]    Date of service: 03/11/2022    Reason for visit:  Dillon Wheeler is 81 y.o.  male who presents today for evaluation of Parkinsonism.  Presents with sister, daughter who aids in providing history. Referred by Dr. Meriel Pica.    Parkinsonism symptom onset ~2015 urinary dysfunction, slowed walking, memory decline. No tremor. Diagnosed with Parkinsonism in 2022 and started on levodopa which initially improved his movements. However since January 2023 he has noticed a significant decline in his function, now requiring assistance with all ADLs, foley catheter, and hospice care support (since April 2023). Walks with USTEP walker but mostly wheelchair bound.     Seen by Dr. Meriel Pica 02/2022 who managed polypharmacy, eliminating ASA/statin, antihypertensives (had OH, better now)    Meds:  carbidopa/levodopa IR 25/100mg  2 tabs QID 7 / 10 / 1 / 4 - denies robust benefit, feels very stiff/slow overnight and in the mornings  Sertraline 100 mg daily     Prior trials-  Donepezil - insomnia, nocturia     Non-Motor Symptoms Scale:    Memory problems: Mild, bothersome  Hallucinations/delusions: None  Depression: Mild, occurs due to a reason  Anxiety: Slight, not bothersome  Apathy: Marked, forced to do daily activities  Impulsive behavior: Mild, affects family  Nighttime sleep: No difficulty  Daytime sleepiness: Moderate, difficulty staying awake  Vivid dreams: Slight, occasional  REM sleep behavior disorder: Mild, frequently talk but not disturbing  Restless leg syndrome: None  Pain or muscle cramps: Slight, pain or cramps occasionally  Urination: Marked, frequent accidents  Constipation: Moderate, need OTC medication  Dizziness or lightheadedness: None  Tiredness/Fatigue: Moderate, often tired  Falling: Moderate, multiple times a month  Personal care assistance: Marked, I have a lot of difficulty and I need help with a majority of tasks daily    Total Score:  Total Score: 68    FH: mother had dementia   Brother has tremor, slowness, gait imbalance - possible PD    Medications:   Current Outpatient Medications on File Prior to Visit   Medication Sig Dispense Refill   ? CHOLEcalciferoL (vitamin D3) (VITAMIN D3) 1,000 units tablet Take five tablets by mouth daily.     ? CYANOCOBALAMIN (VITAMIN B-12) (VITAMIN B-12 PO) Take 1,000 mg by mouth daily.     ? metFORMIN (GLUCOPHAGE) 500 mg tablet TAKE 1 TABLET BY MOUTH TWICE DAILY FOR 7 DAYS THEN 1 IN THE MORNING AND 2 IN THE EVENING FOR 7 DAYS AND THEN 2 TABS TWICE DAILY THEREAFTER 155 tablet 0   ? other medication Take one Dose by mouth twice daily. MacuTab 2X Extra streangth Eye Vitamin     ? pravastatin (PRAVACHOL) 40 mg tablet Take one tablet by mouth daily.     ? sertraline (ZOLOFT) 100 mg tablet      ? vitamins, B complex tab Take one tablet by mouth daily.       No current facility-administered medications on file  prior to visit.       Past Medical History:    Medical History:   Diagnosis Date   ? Abnormal involuntary movement Autumn 2022   ? Cancer [facility]) July 2010    orchiectomy, chemotherapy, radiation,remission   ? Dementia (HCC)    ? Disorganized thinking    ? DM (diabetes mellitus) (HCC) 2013    under control   ? Dyslipidemia    ? History of chemotherapy     R-CHOP   ? Hyperlipidemia    ? Hypertension    ? Memory loss December 2021   ? 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 Parkinson disease [facility]) April 2022        Social History:    Social History     Socioeconomic History   ? Marital status: Widowed   ? Number of children: 3   Tobacco Use   ? Smoking status: Former     Types: Cigars   ? Tobacco comments:     recreational use not habitual   Vaping Use   ? Vaping Use: Never used   Substance and Sexual Activity   ? Alcohol use: Not Currently     Alcohol/week: 2.0 standard drinks of alcohol     Types: 2 Cans of beer per week     Comment: Quit in 2021   ? Drug use: No   ? Sexual activity: Not Currently     Partners: Female     Birth control/protection: None   Social History Narrative    Lives with sister.     Dependent in all IADLs    Widowed 10uyr ago. Was in a long term relationship after that that was unhealthy per family.        Family History:   Family History   Problem Relation Age of Onset   ? Dementia Mother         passed   ? Migraines Sister    ? Diabetes Sister         under control   ? Cancer Brother    ? Migraines Brother    ? Neuropathy Brother    ? Tremor Brother    ? Thyroid Disease Daughter         Hashimoto Disease        Allergies:   Allergies   Allergen Reactions   ? Morphine HALLUCINATIONS       PHYSICAL EXAMINATION:      VITAL SIGNS:   Vitals:    03/11/22 0857   BP: 136/75   BP Source: Arm, Right Upper   Patient Position: Sitting   Pulse: 69       Body mass index is 26.61 kg/m?Marland Kitchen    NEUROLOGICAL EXAM:  MS: Alert, awake, cooperative. Paucity of speech - daughter provides most history     Movement disorders exam  Mod hypophonia  Mod facial masking  No resting tremor  No postural and action tremor   Mod bilateral rigidity  Mod - severe LE > UE bradykinesia with finger taps, supination/pronation, hand grips, or foot taps/stomps  No dystonic posturing  Unable to stand from wheelchair without assistance    +Foley catheter     UPDRS Motor:   Speech: 2 - Monotone, slurred but understandable- moderately impaired.  Facial Expression: 2 - Slight but definitely abnormal diminution of facial expression.  Tremor at Rest Jaw: 0 - Absent.  Tremor at Rest RUE: 0 - Absent  Tremor at Rest LUE:  1 - Slight and infrequently present.  Tremor at Rest RLE: 0 - Absent  Tremor at Rest LLE: 0 - Absent  Action of Postural Tremor of Hands R: 1 - Slight and infrequent.  Action of Postural Tremor of Hands L: 1 - Slight and infrequent.  Rigidity NECK: 2 - Mild to moderate.  Rigidity RUE: 1 - Slight or detectable only when activated by mirror or other movements.  Rigidity LUE: 1 -  Slight or detectable only when activated by mirror or other movements.  Rigidity RLE: 3 - Marked, but full range of motion easily achieved.  Rigidity LLE: 3 - Marked, but full range of motion easily achieved.  Finger Taps L: 3 - Severely impaired. Frequent hesitation in initiating movement or arrests in ongoing movement. (3-6/5 sec)  Hand Movements R: 3 - Severely impaired. Frequent hesitation in initiating movement or arrests in ongoing movement.  Hand Movements L: 2 - Moderately impaired. Definite and early fatiguing. May have occasional arrests in movement.  Rapid Alternating Movement of Hands R: 3 - Severely impaired. Frequent hesitation in initiating movement or arrests in ongoing movement.  Rapid Alternating Movements of Hands L: 3 - Severely impaired. Frequent hesitation in initiating movement or arrests in ongoing movement.  Leg Agility with Knee Bent R: 4 - Can barely perform the task.  Leg Agility with Knee Bent L: 3 - Severely impaired. Frequent hesitation in initiating movement or arrests in ongoing movement.  Arising From Chair: 4 - Unable to arise without help.  Posture: 3 - Severely stooped posture with kyphosis- can be moderately leaning to one side.  Gait: 3 - Severe disturbance of gait, requiring assistance.  Postural Stability: 4 - Unable to stand without assistance.  Body Bradykinesia and Hypokinesia: 4 - Marked slowness, poverty or small amplitude of movement.  Total Motor Exam: 59      Labs/Imaging/Procedures:  DATSCAN 2022  FINDINGS:     There is absence of activity at the posterior left lenticulostriate basal ganglia with mild irregular signal in the right mid lenticulostriate. There is suggestion of slight truncation of posterior activity on the right.     IMPRESSION:     Parkinsonian type radiotracer activity, more conspicuous on the left.     Assessment/Plan:  Dillon Wheeler is a 81 y.o. male who presents today for evaluation of Parkinsonism, symptom onset ~2015 gait decline, memory , urinary dysfunction. DATSCAN in 2022 was abnormal, diagnosed with Parkinsonism and started on levodopa with initial good response in movements. However since January 2023 has suffered a motoric decline and needs assistance with all ADLs. Wheelchair bound, +Foley catheter, in hospice     As he feels very stiff/slow in the mornings and overnight, suggest adding bedtime dose for more 24 hour coverage. Monitor for worsening of confusion / hallucinations on higher dose levodopa     OK to take levodopa with meal, crush into applesauce/yogurt     Discussed management in advanced Parkinson's disease/Parkinsonism- fall, aspiration, infection (bedsore, UTI) prevention, maintain regular bowel movements, stay hydrated, PT/OT and exercise     Continue using USTEP walker and wheelchair     Continue hospice care     Referrals for PT/OT/ST provided     Follow up in 6 months      ICD-9-CM ICD-10-CM    1. Parkinson disease (HCC)  332.0 G20 AMB REFERRAL TO PHYSICAL THERAPY      AMB REFERRAL TO SPEECH THERAPY      carbidopa/levodopa (SINEMET) 25/100 mg tablet  AMB REFERRAL TO OCCUPATIONAL THERAPY      2. Cognitive impairment  294.9 R41.89       3. Gait difficulty  781.2 R26.9       4. Urinary frequency  788.41 R35.0       5. Urinary urgency  788.63 R39.15       6. Other depression  311 F32.89            Beatrix Shipper, MD  Assistant Professor of Neurology  [facility]  [facility]        I spent a total of 60 minutes on this patient's care on the day of their visit excluding time spent related to any billed procedures. This time includes face-to-face time with the patient as well as time spent documenting in the medical record, reviewing patient's records and tests, obtaining history, placing orders, communicating with other healthcare professionals, counseling the patient, family, or caregiver, and/or care coordination for the diagnoses above.

## 2022-03-11 NOTE — Progress Notes
Date of Service: 03/11/2022    Subjective:             Dillon Wheeler is a 81 y.o. male who was referred by Dr. 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    History of Present Illness  He presents with his sister and daughter who help provide the history.    Onset: 2015 he started having problems with urinary continence, and falling.  2018 he started fighting in his sleep.  Memory issues started in 2019.  2021 he had to have a caregiver come into the home.  He was diagnosed with PD in 2022.    Tremor: Slight   Laterality: Right  Gait: Poor early in diease course  Progressive: Yes, it is getting worse  Falls: Yes, see below  Wheelchair: Yes.  Can only use a walker if someone is holding onto him.  Right leg won't lift.  Voice volume: Softer  Articulation: yes  Dysphagia: Choking on solids and liquids, but mostly liquids  Stridor: No  Hyposmia: Not sure  RBD: Yes, since about 2018  Excessive daytime somnolence: Yes, see Epworth below  Constipation: Yes, using miralax and duclolax and prune juice  Orthostatic Hypotension: Yes  Urinary incontinence: Yes  Urinary retention: Yes  Depression/Anxiety: Yes, and fairly severe apathy  Memory: Poor, requires full time care  Hallucinations: Sometimes sees the kids in the bedroom  Behavior/impulsivity: He has exhibited sundowning.  He also picks at things on his clothes and on the furniture  Family History: Possibly his younger brother has PD, but not diagnosed.  Another brother had ALS.  His mother had dementia.    Prior therapy: Sinemet  Levodopa response: Yes, he walks better on Sinemet, but sometimes not until the second dose of the day.  Taking it 7am, 10am, 1pm, and 4pm.  Levodopa-induced dyskinesia: No  Other prior therapy: No  Any dopamine antagonists: No    Prior investigations:  MRI: No  DaT Scan: Abnormal, more so on the left    Living arrangements: Sister lives with him in his house.  Hospice services lend help with his caregiving  Dressing: Dependent  Bathing: Dependent  Toileting: Dependent  Eating:  Independent  Using a phone or land line: Requires assistance  Usual household chores:  Dependent  Meal Preparation:  Dependent  Medication administration: Dependent   Managing finances: Dependent    PDQ 39 IMPACT  Mobility Percent: 90 %  ADL Percent: 91.67 %  Emotional Percent: 37.5 %  Social Stigma Percent: 6.25 %  Social Support Percent: 8.33 %  Cognition Percent: 37.5 %  Communication Percent: 25 %  Body Discomfort Percent: 50 %  PDQ Total Percent: 53.85 %    Symptoms Scale:    Memory problems: Mild, bothersome  Hallucinations/delusions: None  Depression: Mild, occurs due to a reason  Anxiety: Slight, not bothersome  Apathy: Marked, forced to do daily activities  Impulsive behavior: Mild, affects family  Nighttime sleep: No difficulty  Daytime sleepiness: Moderate, difficulty staying awake  Vivid dreams: Slight, occasional  REM sleep behavior disorder: Mild, frequently talk but not disturbing  Restless leg syndrome: None  Pain or muscle cramps: Slight, pain or cramps occasionally  Urination: Marked, frequent accidents  Constipation: Moderate, need OTC medication  Dizziness or lightheadedness: None  Tiredness/Fatigue: Moderate, often tired  Falling: Moderate, multiple times a month  Personal care assistance: Marked, I have a lot of difficulty and I need help with a majority of tasks daily    Total Score:  Total Score: 35    Assistive Devices:  Assistive devices for getting around: Walker    OFF Time:    OFF time: Yes  Hours/day of OFF time: 2  Number of episodes/day: 2  OFF time is bothersome: Some of the time  Muscles spasms or strange posturing: Yes  OFF disability: Troublesome to me    Dyskinesia:    Dyskinesia while awake: No    Employment Status:    Employment: Retired - not due to PD      Geriatric Depression Scale: 17    Epworth Sleepiness Scale: 12      Patient Questionnaire/Fall Risk    I have fallen in the past year:      Yes  I use of have been advised to use assistive devices:  Yes  I feel unsteady while walking     Yes  I steady myself by holding the furniture   No  I am worried about falling     Yes  I need to push with my hands to stand up from chair  Yes  I have trouble stepping onto a curb    Yes  I often have to rush to the toilet    Yes  I have lost feeling in my feet     Yes  I feel lightheaded or tired     No  I take medicine to improve sleep or mood   Yes  I often feel sad or depressed:     Yes    TOTAL:  12           Medical History:   Diagnosis Date   ? Abnormal involuntary movement Autumn 2022   ? Cancer [facility]) July 2010    orchiectomy, chemotherapy, radiation,remission   ? Dementia (HCC)    ? Disorganized thinking    ? DM (diabetes mellitus) (HCC) 2013    under control   ? Dyslipidemia    ? History of chemotherapy     R-CHOP   ? Hyperlipidemia    ? Hypertension    ? Memory loss December 2021   ? 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 Parkinson disease [facility]) April 2022     Surgical History:   Procedure Laterality Date   ? ORCHIECTOMY  03/06/2009    right   ? CHOLECYSTECTOMY     ? COLECTOMY     ? HEMORRHOIDECTOMY     ? MANDIBLE RECONSTRUCTION       Family History   Problem Relation Age of Onset   ? Dementia Mother         passed   ? Migraines Sister    ? Diabetes Sister         under control   ? Cancer Brother    ? Migraines Brother    ? Neuropathy Brother    ? Tremor Brother    ? Thyroid Disease Daughter         Hashimoto Disease     Social History     Tobacco Use   ? Smoking status: Former     Types: Cigars   ? Tobacco comments:     recreational use not habitual   Vaping Use   ? Vaping Use: Never used   Substance Use Topics   ? Alcohol use: Not Currently     Alcohol/week: 2.0 standard drinks of alcohol     Types: 2 Cans of beer per week     Comment: Quit  in 2021   ? Drug use: No          Review of Systems   Constitutional: Positive for activity change.   Respiratory: Positive for choking.    Neurological: Positive for weakness.       Objective:         ? carbidopa/levodopa (SINEMET) 25/100 mg tablet Take two tablets by mouth four times daily.   ? CHOLEcalciferoL (vitamin D3) (VITAMIN D3) 1,000 units tablet Take five tablets by mouth daily.   ? CYANOCOBALAMIN (VITAMIN B-12) (VITAMIN B-12 PO) Take 1,000 mg by mouth daily.   ? metFORMIN (GLUCOPHAGE) 500 mg tablet TAKE 1 TABLET BY MOUTH TWICE DAILY FOR 7 DAYS THEN 1 IN THE MORNING AND 2 IN THE EVENING FOR 7 DAYS AND THEN 2 TABS TWICE DAILY THEREAFTER   ? other medication Take one Dose by mouth twice daily. MacuTab 2X Extra streangth Eye Vitamin   ? pravastatin (PRAVACHOL) 40 mg tablet Take one tablet by mouth daily.   ? sertraline (ZOLOFT) 100 mg tablet    ? vitamins, B complex tab Take one tablet by mouth daily.     Vitals:    03/11/22 0857   BP: 136/75   BP Source: Arm, Right Upper   Pulse: 69   Temp: (!) 35.7 ?C (96.3 ?F)   TempSrc: Skin   PainSc: Zero   Weight: 86.5 kg (190 lb 12.8 oz)   Height: 180.3 cm (5' 11)     Body mass index is 26.61 kg/m?Marland Kitchen     Physical Exam  Constitutional: Well developed male in no acute distress  See vital signs above    Mental Status: Oriented to person, place, and time    Soft speech    Normal naming and arithmetic    Follows commands    Cranial Nerves:   CN II Visual fields intact; PERRL   CN III, IV, VI Extraocular movements intact. Delayed saccades vertically and horizontally   CN V Facial sensation intact   CN VII Face is symmetric; strong eye closure   CN VIII Hearing intact   CN IX Symmetric palatal elevation   CN XI Shoulder shrug  R 5  L 5   CN XII Tongue midline on protrusion       Motor/Muscle Strength:   Right Left  Right Left   Shoulder abductors: 5 5 Hip flexion: 5 5   Elbow flexors: 5 5 Hip abduction:     Elbow extensors: 5 5 Hip Adduction:     Wrist extensors: 5 5 Knee extension: 5 5   Wrist flexors:   Knee flexion: 5 5   Finger flexors:   Ankle dorsiflexion: 5 5   Finger extensors: 5 5 Ankle plantar flexion: 5 5   Finger abductors: 5 5 Ankle eversion:     Thumb abductors:   Ankle inversion:        Toe extension:        Toe flexion:       Reflexes:    Right   Left     Brachioradialis   2 2   Biceps   2 2   Triceps 2 2   Knee 2 2   Ankle 2 2     Sensation:  Light touch intact throughout    Vibration impaired in the feet bilaterally    Coordination/Movement:  Tremor: Minimal postural tremor  Finger to nose: No limb ataxia  Tone: Rigidity is present, most severe in the legs  Repetitive movements:  Marked bradykinesia is present, as well as reduced amplitude  Handwriting: Small  Agraphesthesia: No  Astereognosis: Okay in right, missed on left  Apraxia: No  Aphasia: No  Anterocollis: No  Contractures: No    UPDRS Part III Motor Examination:    Speech: 2 - Monotone, slurred but understandable- moderately impaired.  Facial Expression: 2 - Slight but definitely abnormal diminution of facial expression.  Tremor at Rest Jaw: 0 - Absent.  Tremor at Rest RUE: 0 - Absent  Tremor at Rest LUE: 1 - Slight and infrequently present.  Tremor at Rest RLE: 0 - Absent  Tremor at Rest LLE: 0 - Absent  Action of Postural Tremor of Hands R: 1 - Slight and infrequent.  Action of Postural Tremor of Hands L: 1 - Slight and infrequent.  Rigidity NECK: 2 - Mild to moderate.  Rigidity RUE: 1 - Slight or detectable only when activated by mirror or other movements.  Rigidity LUE: 1 -  Slight or detectable only when activated by mirror or other movements.  Rigidity RLE: 3 - Marked, but full range of motion easily achieved.  Rigidity LLE: 3 - Marked, but full range of motion easily achieved.  Finger Taps L: 3 - Severely impaired. Frequent hesitation in initiating movement or arrests in ongoing movement. (3-6/5 sec)  Hand Movements R: 3 - Severely impaired. Frequent hesitation in initiating movement or arrests in ongoing movement.  Hand Movements L: 2 - Moderately impaired. Definite and early fatiguing. May have occasional arrests in movement.  Rapid Alternating Movement of Hands R: 3 - Severely impaired. Frequent hesitation in initiating movement or arrests in ongoing movement.  Rapid Alternating Movements of Hands L: 3 - Severely impaired. Frequent hesitation in initiating movement or arrests in ongoing movement.  Leg Agility with Knee Bent R: 4 - Can barely perform the task.  Leg Agility with Knee Bent L: 3 - Severely impaired. Frequent hesitation in initiating movement or arrests in ongoing movement.  Arising From Chair: 4 - Unable to arise without help.  Posture: 3 - Severely stooped posture with kyphosis- can be moderately leaning to one side.  Gait: 3 - Severe disturbance of gait, requiring assistance.  Postural Stability: 4 - Unable to stand without assistance.  Body Bradykinesia and Hypokinesia: 4 - Marked slowness, poverty or small amplitude of movement.  Total Motor Exam: 59       Gait:    Unable to stand without assistance.  Stooped posture  Would fall if not assisted  Only able to take small, shuffling steps, and had one freeze    Hoehn and Yahr Stage: 5 - Uses wheelchair or bedridden unless assisted               Assessment and Plan:    Parkinson's Disease  He presents with bradykinesia, rigidity, REM sleep behavior disorder, and autonomic dysfunction, consistent with Parkinson's Disease.  His motor symptoms are advanced and he requires assistance to walk.    - We will add a fifth dose of Sinemet (carbidopa/levodopa) 25/100 two tablets to his regimen at bedtime  - Zoloft has helped with his mood symptoms, and he should continue this  - Miralax and Dulcolax have helped with his constipation and this should also be continued  - We will provide a referral for physical therapy for gait and balance training  - We will also provide a referral for speech language pathology for his choking    Patient will follow up in approximately 6 months.  Domingo Pulse, MD  Movement Disorders Fellow

## 2022-03-12 DIAGNOSIS — G20 Parkinson's disease: Principal | ICD-10-CM

## 2022-03-13 ENCOUNTER — Encounter
Admit: 2022-03-13 | Discharge: 2022-03-13 | Payer: MEDICARE | Primary: Student in an Organized Health Care Education/Training Program

## 2022-03-13 MED ORDER — METFORMIN 500 MG PO TAB
ORAL_TABLET | 0 refills | Status: AC
Start: 2022-03-13 — End: ?

## 2022-03-14 ENCOUNTER — Encounter
Admit: 2022-03-14 | Discharge: 2022-03-14 | Payer: MEDICARE | Primary: Student in an Organized Health Care Education/Training Program

## 2022-03-14 MED ORDER — METFORMIN 500 MG PO TAB
ORAL_TABLET | 0 refills | Status: AC
Start: 2022-03-14 — End: ?

## 2022-03-15 ENCOUNTER — Encounter
Admit: 2022-03-15 | Discharge: 2022-03-15 | Payer: MEDICARE | Primary: Student in an Organized Health Care Education/Training Program

## 2022-04-06 ENCOUNTER — Encounter
Admit: 2022-04-06 | Discharge: 2022-04-06 | Payer: MEDICARE | Primary: Student in an Organized Health Care Education/Training Program

## 2022-04-15 ENCOUNTER — Encounter
Admit: 2022-04-15 | Discharge: 2022-04-15 | Payer: MEDICARE | Primary: Student in an Organized Health Care Education/Training Program

## 2022-05-24 ENCOUNTER — Encounter
Admit: 2022-05-24 | Discharge: 2022-05-24 | Payer: MEDICARE | Primary: Student in an Organized Health Care Education/Training Program

## 2022-05-25 ENCOUNTER — Encounter
Admit: 2022-05-25 | Discharge: 2022-05-25 | Payer: MEDICARE | Primary: Student in an Organized Health Care Education/Training Program

## 2022-05-25 NOTE — Progress Notes
POST-MORTEM DOCUMENTATION    Name: Dillon Wheeler   MRN: [id number]     DOB: Nov 11, 1940      Age: 81 y.o.  Admission Date: (Not on file)     LOS: 0 days     Date of Service: June 16, 2022      Date of death if known:  2022-06-15 at 41p.  Location of death, if known:Home  How were you notified?  Phone  Who notified us of death? Judson Roch, nurse    Was hospice involved? Yes  Name of hospice involved, if known: [facility]  Date of hospice admission, if known: unknown    Other information:

## 2022-06-15 DEATH — deceased

## 2022-10-16 IMAGING — CT L_SPINE_(Adult)
3 of 4 series · 12 of 33 positions shown, 14 images · non-contrast
Comparison: none

[Series 2: l-spine ax 2.00 br60 s3 · axial · 0.34mm/px · z∈[+1841,+2002]mm · 4 of 120 slices shown, 5 images]
[im 20/120  soft-tissue]
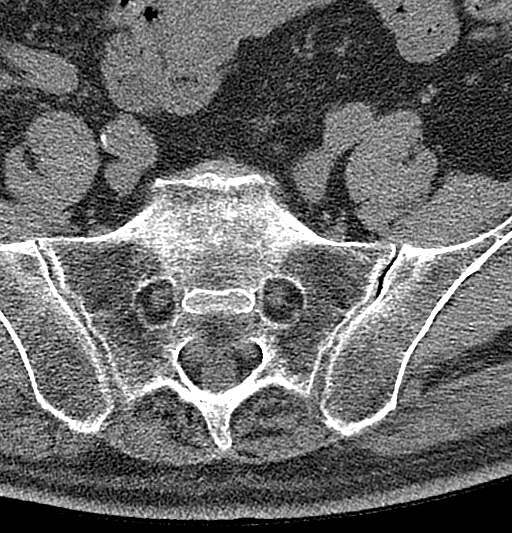
[im 20/120  bone]
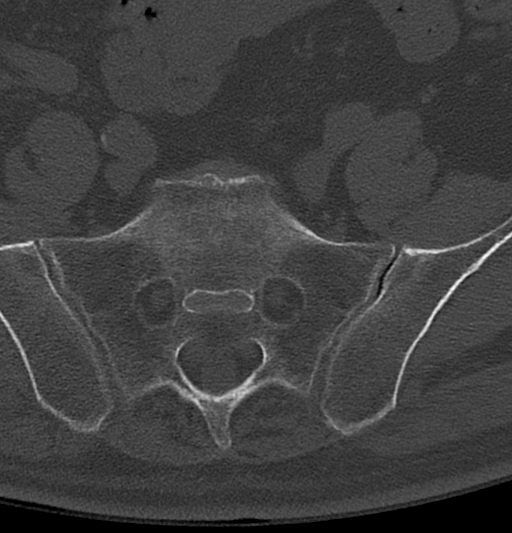
[im 40/120  bone]
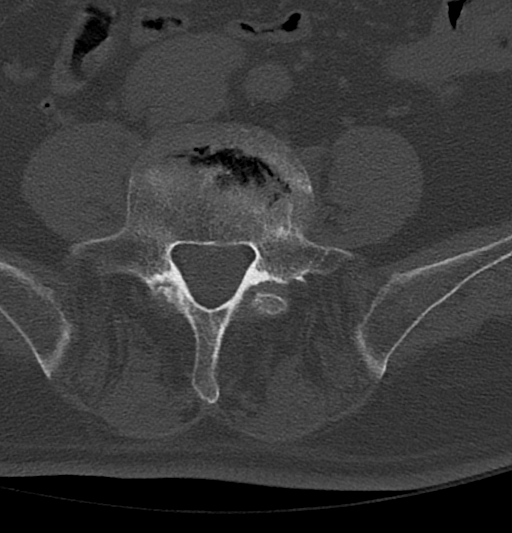
[im 80/120  bone]
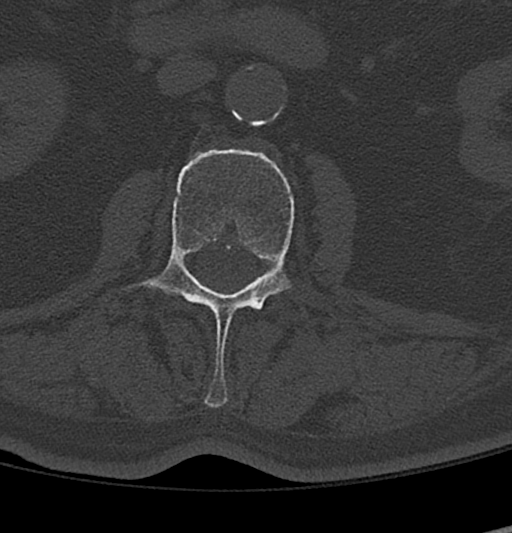
[im 100/120  bone]
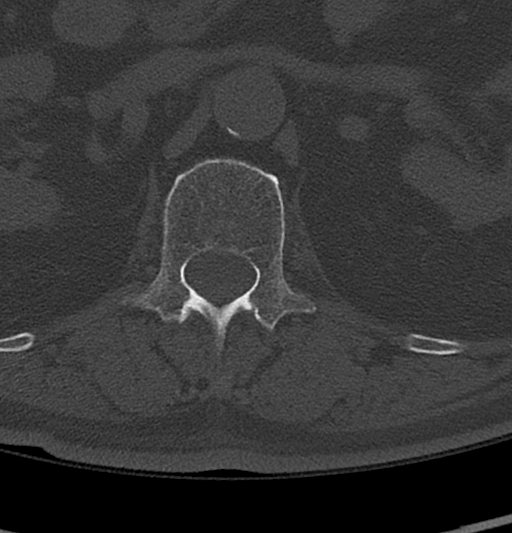

[Series 4: l-spine cor 2.00 br60 s3 · coronal · 0.34mm/px · 3 of 90 slices shown]
[im 18/90  bone]
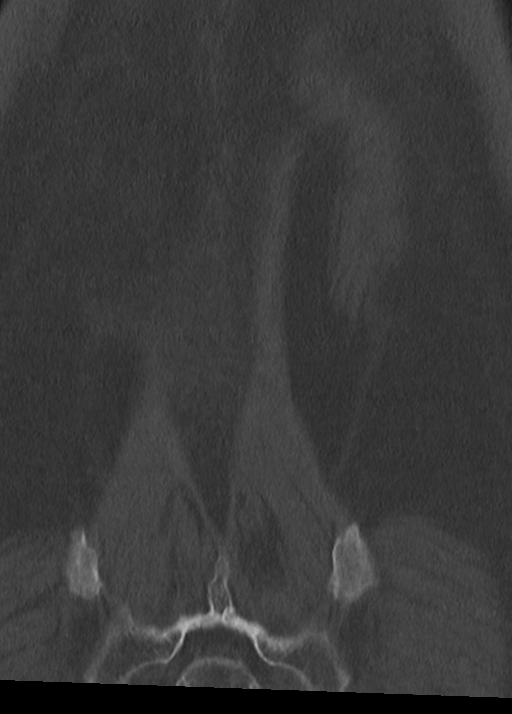
[im 36/90  bone]
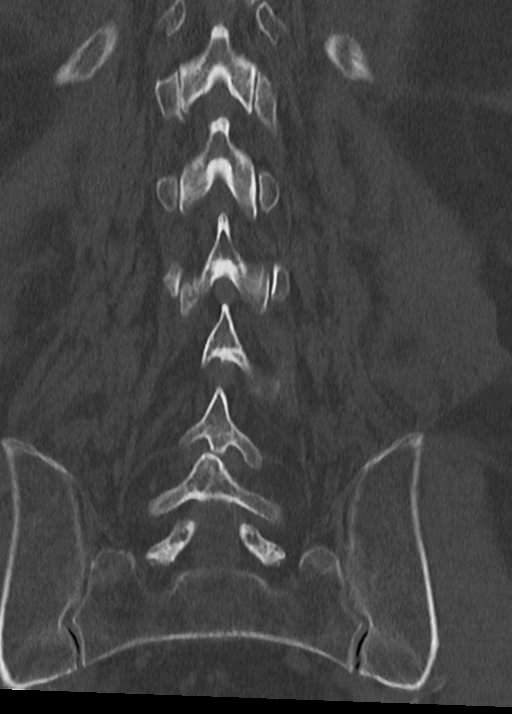
[im 54/90  bone]
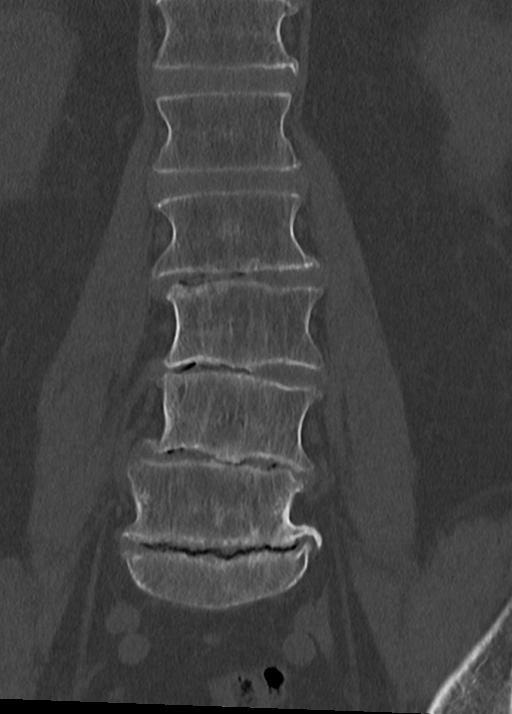

[Series 6: l-spine sag 2.00 br60 s3 · sagittal · 0.36mm/px · 5 of 87 slices shown, 6 images]
[im 29/87  bone]
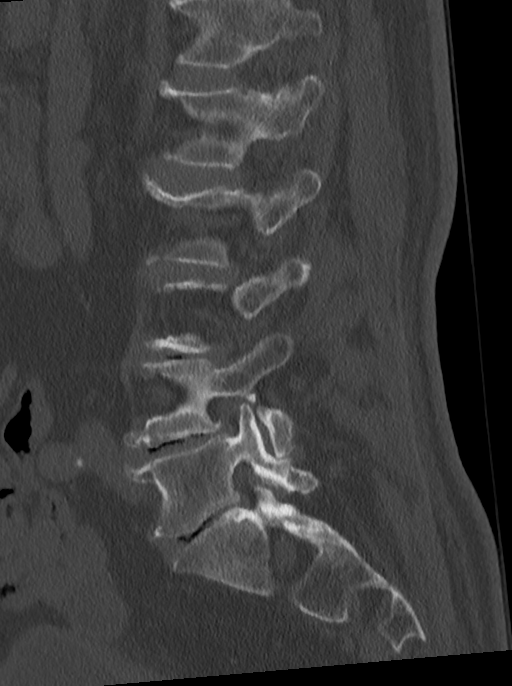
[im 36/87  bone]
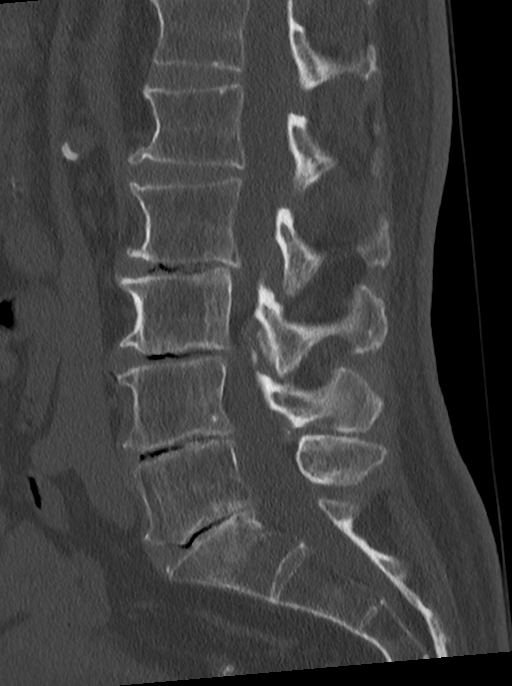
[im 44/87  soft-tissue]
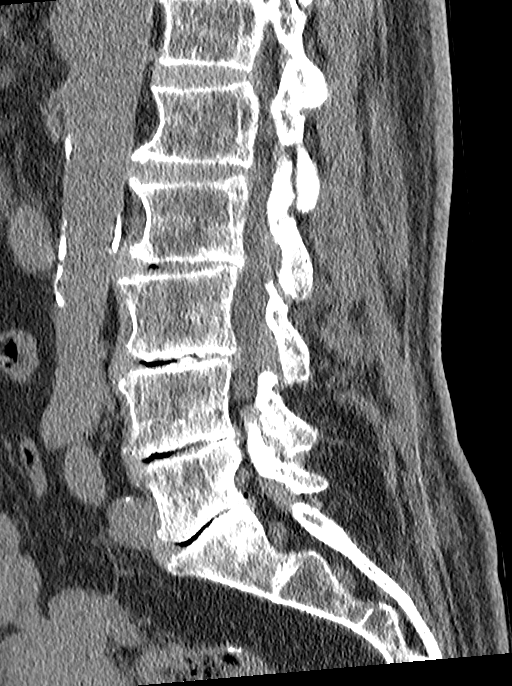
[im 44/87  bone]
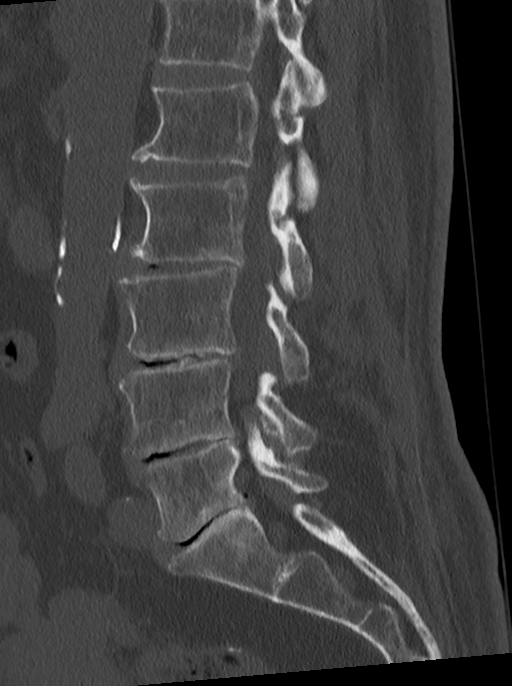
[im 51/87  bone]
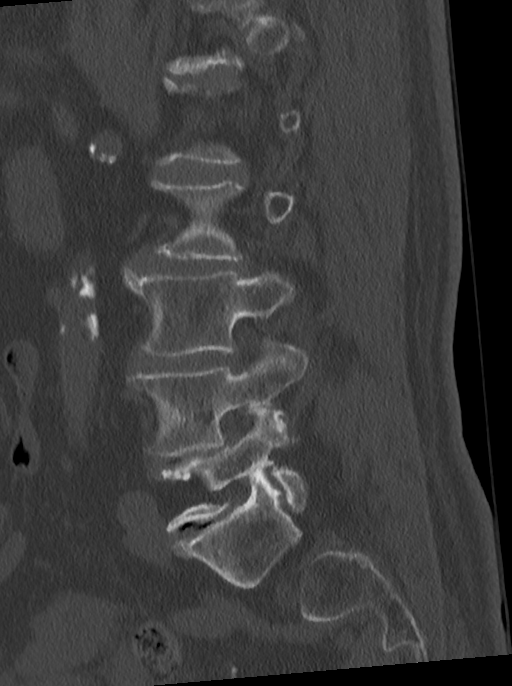
[im 58/87  bone]
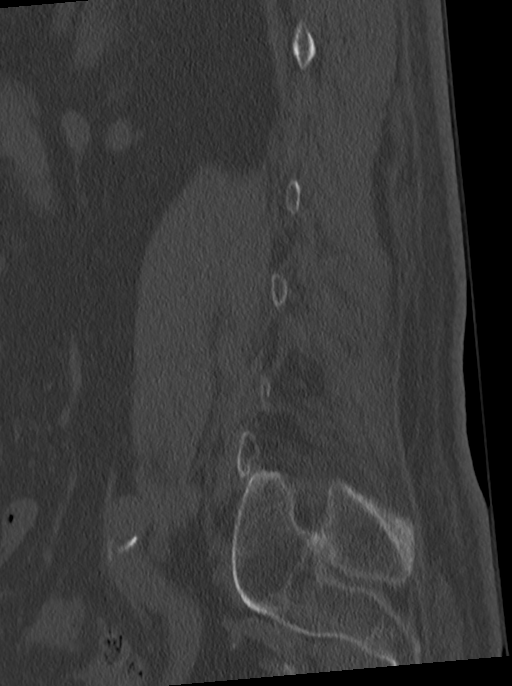

[12 of 33 positions shown; findings below may reference images not displayed]

EXAM

Low back pain, fall

INDICATION

lumbar back pain. fall
CT Lumbar Spine wo. Pt has low back pain after fall. CTDI 11.89, CT/NM 0/0. CS/TJ

FINDINGS

CT of the lumbar spine was performed without contrast.

All CT scans at this facility use dose modulation, iterative reconstruction, and/or weight based
dosing when appropriate to reduce radiation dose to as low as reasonably achievable. In the past 12
months, there have been 0 CT scans N0 nuclear medicine myocardial perfusion scans.

Lumbar vertebral body heights and alignment are normal.

At T12-L1 there is no evidence of disc herniation. There is mild narrowing of the facet joints.

At L1-2, there is mild disc space narrowing. There is no disc herniation.

At L2-3, there is pronounced disc space narrowing with a vacuum disc. There is no disc herniation.
There is mild facet joint space narrowing.

At L3-4, there is pronounced disc space narrowing with a vacuum disc. There is a small broad-based
disc protrusion.

At L4-5, there is pronounced disc space narrowing with a vacuum disc. There is a small broad-based
right paracentral disc protrusion.

At L5-S1, there is pronounced disc space narrowing with a vacuum disc. There is a small right
paracentral disc bulge.

IMPRESSION

There is no fracture or acute appearing abnormality lumbar spine. There is multilevel lumbar spine
spondylosis as described with pronounced multilevel disc space narrowing and vacuum discs.

There is a small broad-based disc protrusion at L3-4 without evidence of nerve root abnormality.

There is a small broad-based right paracentral disc protrusion at L4-5 without evidence of nerve
root abnormality.

There is a small right paracentral disc bulge at L5-S1.

Tech Notes:

CT Lumbar Spine wo. Pt has low back pain after fall. CTDI 11.89, CT/NM 0/0. CS/TJ

## 2022-11-15 ENCOUNTER — Encounter
Admit: 2022-11-15 | Discharge: 2022-11-15 | Payer: MEDICARE | Primary: Student in an Organized Health Care Education/Training Program

## 2023-02-10 IMAGING — CR [ID]
2 series · 2 of 2 positions shown · non-contrast
Comparison: none

[chest pa]
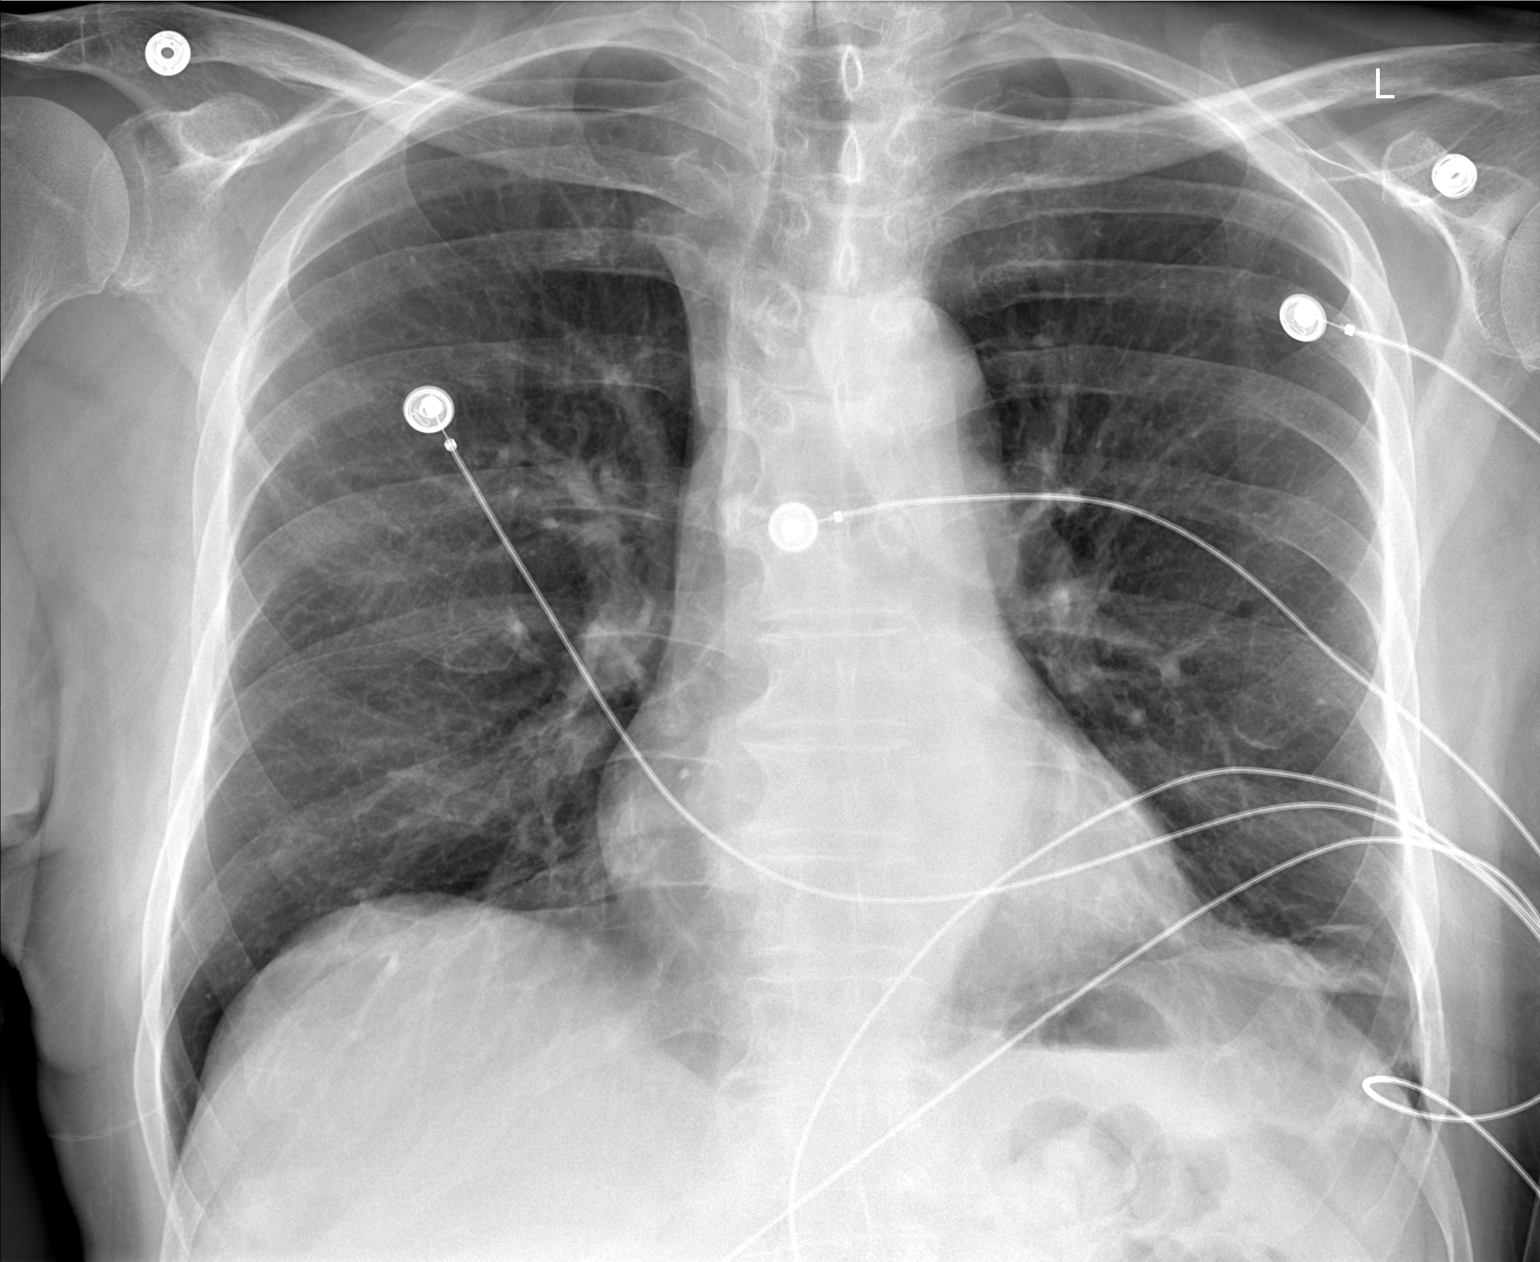

[chest lat]
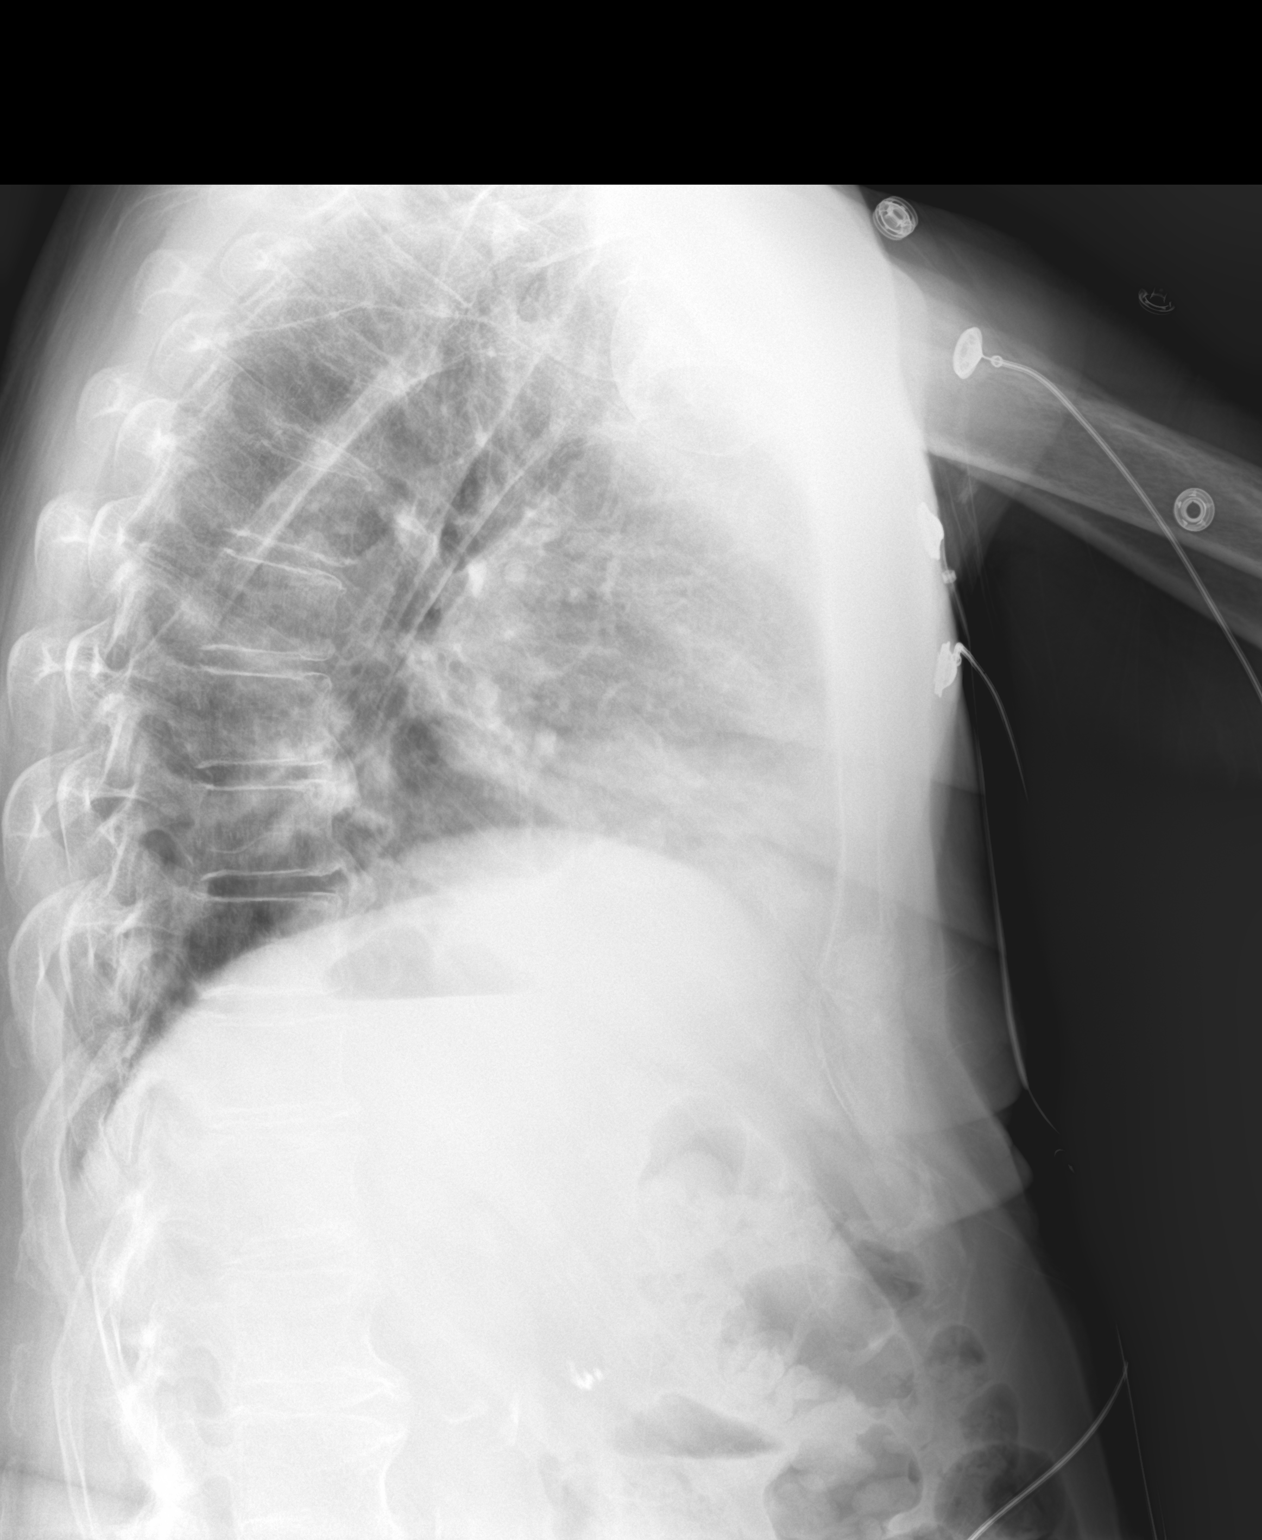

[2 of 2 positions shown; findings below may reference images not displayed]

08/31/21

EXAM

CHEST 2 VIEW

INDICATION

reported low oxygen level
pts family states low oxygen level- denies cough or chest pain

TECHNIQUE

2 views of the chest were acquired.

COMPARISONS

None

FINDINGS

The cardiomediastinal silhouette is within normal limits.  There is no failure or effusion.

There is minimal left basilar atelectasis or scarring.

IMPRESSION

Minimal left basilar opacity is noted.

Tech Notes:

pts family states low oxygen level- denies cough or chest pain
